# Patient Record
Sex: Female | Born: 1990 | Race: Asian | Hispanic: No | Smoking: Never smoker
Health system: Southern US, Community
[De-identification: ages and names within clinical notes are randomized; demographics above are authoritative.]

## PROBLEM LIST (undated history)

## (undated) DIAGNOSIS — O99810 Abnormal glucose complicating pregnancy: Secondary | ICD-10-CM

## (undated) DIAGNOSIS — O4590 Premature separation of placenta, unspecified, unspecified trimester: Secondary | ICD-10-CM

## (undated) HISTORY — DX: Abnormal glucose complicating pregnancy: O99.810

## (undated) HISTORY — DX: Premature separation of placenta, unspecified, unspecified trimester: O45.90

---
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
## 2008-01-07 ENCOUNTER — Ambulatory Visit: Payer: Self-pay | Admitting: *Deleted

## 2008-01-10 ENCOUNTER — Ambulatory Visit (HOSPITAL_COMMUNITY): Admission: RE | Admit: 2008-01-10 | Discharge: 2008-01-10 | Payer: Self-pay | Admitting: Obstetrics & Gynecology

## 2008-01-10 ENCOUNTER — Ambulatory Visit: Payer: Self-pay | Admitting: Obstetrics & Gynecology

## 2008-01-21 ENCOUNTER — Ambulatory Visit: Payer: Self-pay | Admitting: Obstetrics & Gynecology

## 2008-01-23 ENCOUNTER — Encounter: Payer: Self-pay | Admitting: Obstetrics & Gynecology

## 2008-01-23 ENCOUNTER — Inpatient Hospital Stay (HOSPITAL_COMMUNITY): Admission: AD | Admit: 2008-01-23 | Discharge: 2008-01-26 | Payer: Self-pay | Admitting: Obstetrics & Gynecology

## 2008-01-23 ENCOUNTER — Ambulatory Visit: Payer: Self-pay | Admitting: Obstetrics & Gynecology

## 2008-01-28 ENCOUNTER — Encounter (INDEPENDENT_AMBULATORY_CARE_PROVIDER_SITE_OTHER): Payer: Self-pay | Admitting: Family Medicine

## 2008-03-13 ENCOUNTER — Telehealth: Payer: Self-pay | Admitting: *Deleted

## 2008-03-27 ENCOUNTER — Ambulatory Visit: Payer: Self-pay | Admitting: Family Medicine

## 2008-07-03 ENCOUNTER — Encounter: Payer: Self-pay | Admitting: Family Medicine

## 2008-07-03 ENCOUNTER — Ambulatory Visit: Payer: Self-pay | Admitting: Family Medicine

## 2008-07-03 LAB — CONVERTED CEMR LAB
Beta hcg, urine, semiquantitative: NEGATIVE
HCT: 37 % (ref 36.0–46.0)
Hemoglobin: 12 g/dL (ref 12.0–15.0)
MCHC: 32.4 g/dL (ref 30.0–36.0)
RDW: 13.6 % (ref 11.5–15.5)

## 2008-07-04 ENCOUNTER — Encounter: Payer: Self-pay | Admitting: Family Medicine

## 2008-08-27 ENCOUNTER — Emergency Department (HOSPITAL_COMMUNITY): Admission: EM | Admit: 2008-08-27 | Discharge: 2008-08-27 | Payer: Self-pay | Admitting: *Deleted

## 2010-04-14 ENCOUNTER — Emergency Department (HOSPITAL_COMMUNITY): Admission: EM | Admit: 2010-04-14 | Discharge: 2010-04-14 | Payer: Self-pay | Admitting: Family Medicine

## 2010-05-14 ENCOUNTER — Ambulatory Visit: Payer: Self-pay | Admitting: Family Medicine

## 2010-06-08 ENCOUNTER — Ambulatory Visit: Payer: Self-pay | Admitting: Family Medicine

## 2010-06-08 ENCOUNTER — Encounter: Payer: Self-pay | Admitting: Family Medicine

## 2010-06-08 LAB — CONVERTED CEMR LAB
Antibody Screen: NEGATIVE
Eosinophils Absolute: 0.1 10*3/uL (ref 0.0–0.7)
Eosinophils Relative: 1 % (ref 0–5)
HCT: 32.7 % — ABNORMAL LOW (ref 36.0–46.0)
Hepatitis B Surface Ag: NEGATIVE
Lymphs Abs: 1.7 10*3/uL (ref 0.7–4.0)
MCV: 73.5 fL — ABNORMAL LOW (ref 78.0–100.0)
Monocytes Absolute: 0.4 10*3/uL (ref 0.1–1.0)
Monocytes Relative: 5 % (ref 3–12)
Platelets: 187 10*3/uL (ref 150–400)
Rh Type: POSITIVE
Sickle Cell Screen: NEGATIVE
WBC: 7.4 10*3/uL (ref 4.0–10.5)

## 2010-06-11 ENCOUNTER — Ambulatory Visit: Payer: Self-pay | Admitting: Family Medicine

## 2010-06-25 DIAGNOSIS — O4590 Premature separation of placenta, unspecified, unspecified trimester: Secondary | ICD-10-CM

## 2010-06-25 HISTORY — DX: Premature separation of placenta, unspecified, unspecified trimester: O45.90

## 2010-07-10 ENCOUNTER — Inpatient Hospital Stay (HOSPITAL_COMMUNITY)
Admission: AD | Admit: 2010-07-10 | Discharge: 2010-07-10 | Payer: Self-pay | Source: Home / Self Care | Admitting: Family Medicine

## 2010-07-12 ENCOUNTER — Encounter: Payer: Self-pay | Admitting: *Deleted

## 2010-07-29 ENCOUNTER — Encounter: Payer: Self-pay | Admitting: Obstetrics & Gynecology

## 2010-07-29 ENCOUNTER — Ambulatory Visit: Payer: Self-pay | Admitting: Obstetrics & Gynecology

## 2010-08-09 ENCOUNTER — Ambulatory Visit (HOSPITAL_COMMUNITY)
Admission: RE | Admit: 2010-08-09 | Discharge: 2010-08-09 | Payer: Self-pay | Source: Home / Self Care | Attending: Family Medicine | Admitting: Family Medicine

## 2010-08-26 ENCOUNTER — Ambulatory Visit: Admit: 2010-08-26 | Payer: Self-pay | Admitting: Obstetrics and Gynecology

## 2010-09-12 ENCOUNTER — Encounter: Payer: Self-pay | Admitting: Family Medicine

## 2010-09-12 ENCOUNTER — Encounter: Payer: Self-pay | Admitting: *Deleted

## 2010-09-23 NOTE — Assessment & Plan Note (Signed)
Summary: Initial OB visit   Vital Signs:  Patient profile:   20 year old female LMP:     03/30/2010 Height:      61.75 inches Weight:      118 pounds BMI:     21.84 Temp:     97.5 degrees F oral Pulse rate:   83 / minute BP sitting:   105 / 61  (left arm) Cuff size:   regular  Vitals Entered ByBethena Midget  (June 11, 2010 3:34 PM) CC: OB  10    1/7 LMP (date): 03/30/2010 EDC by LMP==> 01/04/2011 EDC 01/04/2011 LMP - Character: normal LMP - Reliable? YES Menarche (age onset years): 12   Menses interval (days): 28 Menstrual flow (days): 6 On BCP's at conception: no Date of + home preg. test: 06/23/2007 Enter LMP: 03/30/2010 Last PAP Result Normal, Satisfactory   Visit Type:  New OB Primary Provider:  Priscella Mann MD  CC:  OB  10    1/7.  History of Present Illness: Pt feels safe @ home; financially stable environment. Pregnancy was unexpected but is now anticipated.  Fairly certain of LMP; had been having regular monthly periods up until then.   Current Medications (verified): 1)  Th Prenatal Vitamins 28-0.8 Mg Tabs (Prenatal Vit-Fe Fumarate-Fa) .Marland Kitchen.. 1 By Mouth Daily 2)  Promethazine Hcl 25 Mg Tabs (Promethazine Hcl) .Marland Kitchen.. 1 By Mouth Q 4-6 Hrs As Needed Nausea  Allergies (verified): No Known Drug Allergies  Past History:  Past Medical History: h/o childhood asthma h/o amenorrhea/dysfunctional uterine bleeding  GERD alleviated by occasional OTC Prilosec  Past Surgical History: C-section for previous pregnancy in 2009 due to placental abruption @  ~32 weeks.   Family History: Father: severe gastritis  Social History: Lives with husband and baby girl (now 2 1/2 years). Occupation: Attends [employer]; good grades.   No tobacco, EtOH, or recreational drugs.    Physical Exam  General:  NAD; appears happy.  Neck:  no masses.   Lungs:  CTAB Heart:  RRR, no m/r/g Abdomen:  NABS, soft, not yet gravid, fundus firm to deep palpation, NT Pulses:  2+  radial/DP pulses B/L Extremities:  no pedal edema B/L Psych:  normally interactive and good eye contact.     Impression & Recommendations:  Problem # 1:  PREGNANCY, NORMAL, MULTIGRAVIDA (ICD-V22.1) Assessment Deteriorated Patient looking forward to new baby and is in good social/financial situation.  Reassured pt of normal labs.  Discussed Integrative/Quad screen c pt who denied b/c would not change management of pregnancy.  Pt will f/u in 4-6 wks. Due to h/o GDM in previous pregnancy, asked pt make lab appt for 1-hr Glucola as soon as conveniently able.  Pt taking PNV & has promethazine for nausea. Reassured pt OK to take anti-emetic as needed (no harm to fetus).   Orders: Other OB visit- [facility] ([facility])  Complete Medication List: 1)  Th Prenatal Vitamins 28-0.8 Mg Tabs (Prenatal vit-fe fumarate-fa) .Marland Kitchen.. 1 by mouth daily 2)  Promethazine Hcl 25 Mg Tabs (Promethazine hcl) .Marland Kitchen.. 1 by mouth q 4-6 hrs as needed nausea  Patient Instructions: 1)  Please schedule 1 hour glucola before you go. 2)  Please schedule your next OB follow-up appointment in 4-6 weeks.  3)  It was nice to meet you.    Orders Added: 1)  Other OB visit- [facility] [[facility]]     Flowsheet View for Follow-up Visit    Estimated weeks of       gestation:  10 3/7    Weight:     118    Blood pressure:   105 / 61    Hx headache?     No    Nausea/vomiting?   No    Edema?     0    Bleeding?     no    Leakage/discharge?   no   OB Initial Intake Information    Positive HCG by: self    Race: Oriental/Asian    Marital status: Single    Occupation: student    Type of work: [employer] [place]    Number of children at home: Intel of delivery: [facility] [place]    Newborn's physician: Ancil Boozer MD  FOB Information    Husband/Father of baby: Delice Lesch    FOB occupation Holiday representative    Phone: [phone]    FOB Comments: Involved.  Currently unemployed.    Menstrual History    LMP (date):  03/30/2010    EDC by LMP: 01/04/2011    Best Working EDC: 01/04/2011    LMP - Character: normal    LMP - Reliable? : YES    Menarche: 12 years    Menses interval: 28 days    Menstrual flow 6 days    On BCP's at conception: no    Date of positive (+) home preg. test: 06/23/2007    Pre Pregnancy Weight: 115 lbs.  Prenatal Visit EDC Confirmation:    New working [facility]: 01/04/2011    LMP reliable? YES    Last menses onset (LMP) date: 03/30/2010    EDC by LMP: 01/04/2011   Past Pregnancy History    Gravida:     2    Premature Births:   1    Living Children:   1    Para:       1    Prev C-Section:   1  Pregnancy # 1    Delivery type:     C-section    Delivery location:     [facility]    Infant Sex:     Female   Genetic History    Father of baby:   Delice Lesch   Flowsheet View for Follow-up Visit    Estimated weeks of       gestation:     10 3/7    Weight:     118    Blood pressure:   105 / 61    Headache:     No    Nausea/vomiting:   No    Edema:     0    Vaginal bleeding:   no    Vaginal discharge:   no  Appended Document: Initial OB visit Chart reviewed.  Would add prior 32-week delivery 2o to abruption to OB Hx.  For referral to [facility] consult in light of 32-week preterm delivery.  Needs cervical cultures (GC/Chlamydia).  Appended Document: Referral        Past Pregnancy History  Pregnancy #1  Gestational age at delivery: 39 weeks  Delivery type: C-section  [facility]  Comments: C/S due to abruption     Today's Evaluation EGA: [redacted]W[redacted]D    New Problems PLACENTA ABRUPTIO (ICD-641.20)   Allergies NKA              Initial Obstetrical Visit HPI Birttany is a 20 Years Old G: 2 P: 0 1 0 1  who presents for initial visit. She has had no spontaneous abortions.  She has not had any elective abortions. Patient has no history of ectopic pregnancies.       Prior Pregnancies         Prior C-sect: 1.          Prior multiple gestations:  0.      OB Initial Intake Information    Positive HCG by: self    Race: Oriental/Asian    Marital status: Single    Occupation: student    Type of work: [employer] [place]    Number of children at home: [employer] of delivery: [facility]    Newborn's physician: Ancil Boozer MD  FOB Information    Husband/Father of baby: Delice Lesch    FOB occupation Holiday representative    Phone: [phone]    FOB Comments: Involved.  Currently unemployed.    Menstrual History    LMP (date): 03/30/2010    LMP - Character: normal    Menarche: 12 years    Menses interval: 28 days    Menstrual flow 6 days    On BCP's at conception: no    Date of positive (+) home preg. test: 06/23/2007    Past Pregnancy History  Pregnancy # [redacted]    Weeks Gestation:   32    Comments:     C/S due to abruption

## 2010-09-23 NOTE — Assessment & Plan Note (Signed)
Summary: preg test,df   Vital Signs:  Patient profile:   20 year old female Height:      61.75 inches Weight:      112 pounds BMI:     20.73 BSA:     1.49 Temp:     98.3 degrees F Pulse rate:   80 / minute BP sitting:   90 / 50  Vitals Entered By: Jone Baseman CMA (May 14, 2010 9:47 AM) CC: uPREG Is Patient Diabetic? No Pain Assessment Patient in pain? no        Primary Care Provider:  Ancil Boozer  MD  CC:  uPREG.  History of Present Illness: Here for preg. verification.  Last menses was normal and on 03/30/2010. 2 pos. UPT's at home.  Experiencing reflux, morning sickness and fatigue.  No abd. pain or bleeding. H/o C-S with last delivery secondary to placental abruption related to fall.  Habits & Providers  Alcohol-Tobacco-Diet     Tobacco Status: never     Cigarette Packs/Day: n/a  Current Problems (verified): 1)  Amenorrhea  (ICD-626.0) 2)  Dysfunctional Uterine Bleeding  (ICD-626.8) 3)  Fatigue  (ICD-780.79) 4)  Excessive or Frequent Menstruation  (ICD-626.2) 5)  Routine Postpartum Follow-up  (ICD-V24.2) 6)  Hx of Diabetes Mellitus, Gestational, Borderline  (ICD-648.80) 7)  Screening For Malignant Neoplasm of The Cervix  (ICD-V76.2)  Current Medications (verified): 1)  Reglan 10 Mg  Tabs (Metoclopramide Hcl) .Marland Kitchen.. 1 By Mouth Three Times A Day For Breast Milk Production.  Be Sure To Drink [employer].  Allergies (verified): No Known Drug Allergies  Past History:  Past Medical History: Last updated: 08/31/2007 h/o childhood asthma, monthly periods, ave flow, 4d, referred for corrective lenses 7/05, S/P HBV vaccine x3  Family History: Last updated: 10/19/2006 father has severe gastritis  Social History: Last updated: 07/03/2008 Lives with husband and baby.  Attends [employer]  Good grades.  No tobacco, EtOH, or recreational drugs.    Risk Factors: Smoking Status: never (05/14/2010) Packs/Day: n/a (05/14/2010)  Past Surgical History: none  - 03/17/2004 Caesarean section  Review of Systems  The patient denies fever, weight loss, chest pain, syncope, prolonged cough, headaches, abdominal pain, and hematochezia.    Physical Exam  General:  alert, well-developed, and well-nourished.   Neck:  supple.   Lungs:  normal respiratory effort.   Heart:  normal rate.   Abdomen:  soft and non-tender.     Impression & Recommendations:  Problem # 1:  PREGNANCY, NORMAL, MULTIGRAVIDA (ICD-V22.1)  Orders: [facility]- Est Level  3 ([address])  Complete Medication List: 1)  Th Prenatal Vitamins 28-0.8 Mg Tabs (Prenatal vit-fe fumarate-fa) .Marland Kitchen.. 1 by mouth daily 2)  Promethazine Hcl 25 Mg Tabs (Promethazine hcl) .Marland Kitchen.. 1 by mouth q 4-6 hrs as needed nausea  Other Orders: U Preg-[facility] ([address])  Patient Instructions: 1)  We will call you with your first prenatal appointment. Prescriptions: PROMETHAZINE HCL 25 MG TABS (PROMETHAZINE HCL) 1 by mouth q 4-6 hrs as needed nausea  #42 x 2   Entered and Authorized by:   Tinnie Gens MD   Signed by:   Tinnie Gens MD on 05/14/2010   Method used:   Electronically to        [facility]  [address] [phone]* (retail)       [address]       [place], [place]  [address]       Ph: [phone] or [phone]  Fax: [phone]   RxID:   [id number] TH PRENATAL VITAMINS 28-0.8 MG TABS (PRENATAL VIT-FE FUMARATE-FA) 1 by mouth daily  #180 x 3   Entered and Authorized by:   Tinnie Gens MD   Signed by:   Tinnie Gens MD on 05/14/2010   Method used:   [facility]        [facility] [phone]* (retail)       [address]       [place], [place]  [address]       Ph: [phone] or [phone]       Fax: [phone]   RxID:   [id number]   Laboratory Results   Urine Tests  Date/Time Received: May 14, 2010 9:46 AM  Date/Time Reported: May 14, 2010 10:05 AM     Urine HCG: positive Comments: ...............test performed  by......Marland KitchenBonnie A. Sw[place], MLS (ASCP)cm

## 2010-09-23 NOTE — Letter (Signed)
Summary: Handout Printed  Printed Handout:  - Prenatal-Flowsheet-[facility] 

## 2010-09-23 NOTE — Letter (Signed)
Summary: Generic Letter  All     ,     Phone:   Fax:     05/14/2010  [name]  Date of Birth  [birth year]   To Whom it May Concern:  This letter is written to confirm pregnancy.  She has a positive pregnancy test.  Her working [facility] is 01/06/2011.  Sincerely,     Tinnie Gens MD

## 2010-10-05 ENCOUNTER — Encounter: Payer: Self-pay | Admitting: *Deleted

## 2010-11-02 LAB — CBC
HCT: 31.4 % — ABNORMAL LOW (ref 36.0–46.0)
MCH: 26 pg (ref 26.0–34.0)
MCHC: 33.5 g/dL (ref 30.0–36.0)
RDW: 14.5 % (ref 11.5–15.5)

## 2010-11-02 LAB — WET PREP, GENITAL: Yeast Wet Prep HPF POC: NONE SEEN

## 2010-11-02 LAB — URINALYSIS, ROUTINE W REFLEX MICROSCOPIC
Bilirubin Urine: NEGATIVE
Hgb urine dipstick: NEGATIVE
Specific Gravity, Urine: 1.025 (ref 1.005–1.030)
pH: 6 (ref 5.0–8.0)

## 2010-11-02 LAB — POCT URINALYSIS DIPSTICK
Bilirubin Urine: NEGATIVE
Hgb urine dipstick: NEGATIVE
Ketones, ur: NEGATIVE mg/dL
Specific Gravity, Urine: 1.025 (ref 1.005–1.030)
pH: 7 (ref 5.0–8.0)

## 2010-11-02 LAB — GC/CHLAMYDIA PROBE AMP, GENITAL
Chlamydia, DNA Probe: NEGATIVE
GC Probe Amp, Genital: NEGATIVE

## 2010-11-05 LAB — POCT H PYLORI SCREEN: H. PYLORI SCREEN, POC: NEGATIVE

## 2010-11-05 LAB — POCT URINALYSIS DIPSTICK
Glucose, UA: NEGATIVE mg/dL
Hgb urine dipstick: NEGATIVE
Nitrite: NEGATIVE
Urobilinogen, UA: 1 mg/dL (ref 0.0–1.0)

## 2010-11-05 LAB — POCT PREGNANCY, URINE: Preg Test, Ur: NEGATIVE

## 2010-11-22 ENCOUNTER — Other Ambulatory Visit: Payer: Self-pay | Admitting: Obstetrics & Gynecology

## 2010-11-22 DIAGNOSIS — O34219 Maternal care for unspecified type scar from previous cesarean delivery: Secondary | ICD-10-CM

## 2010-11-22 DIAGNOSIS — Z331 Pregnant state, incidental: Secondary | ICD-10-CM

## 2010-11-22 LAB — POCT URINALYSIS DIP (DEVICE)
Glucose, UA: NEGATIVE mg/dL
Nitrite: NEGATIVE
Protein, ur: NEGATIVE mg/dL
Specific Gravity, Urine: 1.025 (ref 1.005–1.030)
Urobilinogen, UA: 0.2 mg/dL (ref 0.0–1.0)

## 2010-11-29 ENCOUNTER — Other Ambulatory Visit: Payer: Self-pay | Admitting: Obstetrics & Gynecology

## 2010-11-29 ENCOUNTER — Other Ambulatory Visit: Payer: Self-pay | Admitting: Obstetrics and Gynecology

## 2010-11-29 DIAGNOSIS — O34219 Maternal care for unspecified type scar from previous cesarean delivery: Secondary | ICD-10-CM

## 2010-11-29 DIAGNOSIS — Z331 Pregnant state, incidental: Secondary | ICD-10-CM

## 2010-11-29 LAB — POCT URINALYSIS DIP (DEVICE)
Bilirubin Urine: NEGATIVE
Glucose, UA: NEGATIVE mg/dL
Hgb urine dipstick: NEGATIVE
Ketones, ur: NEGATIVE mg/dL
Nitrite: NEGATIVE
Protein, ur: NEGATIVE mg/dL
Specific Gravity, Urine: 1.02 (ref 1.005–1.030)
Urobilinogen, UA: 0.2 mg/dL (ref 0.0–1.0)
pH: 8 (ref 5.0–8.0)

## 2010-12-02 ENCOUNTER — Ambulatory Visit (HOSPITAL_COMMUNITY)
Admission: RE | Admit: 2010-12-02 | Discharge: 2010-12-02 | Disposition: A | Discharge status: Home / Self Care | Payer: Self-pay | Source: Ambulatory Visit | Attending: Obstetrics & Gynecology | Admitting: Obstetrics & Gynecology

## 2010-12-02 ENCOUNTER — Other Ambulatory Visit: Payer: Self-pay | Admitting: Obstetrics & Gynecology

## 2010-12-02 DIAGNOSIS — O34219 Maternal care for unspecified type scar from previous cesarean delivery: Secondary | ICD-10-CM

## 2010-12-02 DIAGNOSIS — O09299 Supervision of pregnancy with other poor reproductive or obstetric history, unspecified trimester: Secondary | ICD-10-CM | POA: Insufficient documentation

## 2010-12-06 LAB — POCT I-STAT, CHEM 8
BUN: 15 mg/dL (ref 6–23)
Creatinine, Ser: 0.8 mg/dL (ref 0.4–1.2)
Hemoglobin: 13.9 g/dL (ref 12.0–15.0)
Potassium: 3.8 mEq/L (ref 3.5–5.1)
Sodium: 139 mEq/L (ref 135–145)
TCO2: 25 mmol/L (ref 0–100)

## 2010-12-06 LAB — DIFFERENTIAL
Basophils Relative: 1 % (ref 0–1)
Lymphocytes Relative: 27 % (ref 12–46)
Lymphs Abs: 1.8 10*3/uL (ref 0.7–4.0)
Monocytes Absolute: 0.3 10*3/uL (ref 0.1–1.0)
Monocytes Relative: 4 % (ref 3–12)
Neutro Abs: 4.7 10*3/uL (ref 1.7–7.7)
Neutrophils Relative %: 68 % (ref 43–77)

## 2010-12-06 LAB — URINALYSIS, ROUTINE W REFLEX MICROSCOPIC
Bilirubin Urine: NEGATIVE
Hgb urine dipstick: NEGATIVE
Nitrite: NEGATIVE
Specific Gravity, Urine: 1.036 — ABNORMAL HIGH (ref 1.005–1.030)
Urobilinogen, UA: 0.2 mg/dL (ref 0.0–1.0)
pH: 6 (ref 5.0–8.0)

## 2010-12-06 LAB — POCT PREGNANCY, URINE: Preg Test, Ur: NEGATIVE

## 2010-12-06 LAB — CBC
Hemoglobin: 12.7 g/dL (ref 12.0–15.0)
RBC: 5.18 MIL/uL — ABNORMAL HIGH (ref 3.87–5.11)
WBC: 6.9 10*3/uL (ref 4.0–10.5)

## 2010-12-09 DIAGNOSIS — Z0189 Encounter for other specified special examinations: Secondary | ICD-10-CM

## 2010-12-23 ENCOUNTER — Inpatient Hospital Stay (HOSPITAL_COMMUNITY)
Admission: AD | Admit: 2010-12-23 | Discharge: 2010-12-25 | DRG: 775 | Disposition: A | Discharge status: Home / Self Care | Payer: Medicaid Other | Source: Ambulatory Visit | Attending: Obstetrics & Gynecology | Admitting: Obstetrics & Gynecology

## 2010-12-23 ENCOUNTER — Other Ambulatory Visit: Payer: Self-pay | Admitting: Obstetrics and Gynecology

## 2010-12-23 DIAGNOSIS — O34219 Maternal care for unspecified type scar from previous cesarean delivery: Secondary | ICD-10-CM

## 2010-12-23 DIAGNOSIS — Z331 Pregnant state, incidental: Secondary | ICD-10-CM

## 2010-12-23 DIAGNOSIS — O09299 Supervision of pregnancy with other poor reproductive or obstetric history, unspecified trimester: Secondary | ICD-10-CM

## 2010-12-23 DIAGNOSIS — O0930 Supervision of pregnancy with insufficient antenatal care, unspecified trimester: Secondary | ICD-10-CM

## 2010-12-23 LAB — CBC
HCT: 34.8 % — ABNORMAL LOW (ref 36.0–46.0)
Hemoglobin: 11.8 g/dL — ABNORMAL LOW (ref 12.0–15.0)
RBC: 4.61 MIL/uL (ref 3.87–5.11)

## 2010-12-23 LAB — POCT URINALYSIS DIP (DEVICE)
Bilirubin Urine: NEGATIVE
Glucose, UA: NEGATIVE mg/dL
Ketones, ur: NEGATIVE mg/dL
Nitrite: NEGATIVE

## 2010-12-24 LAB — RPR: RPR Ser Ql: NONREACTIVE

## 2011-01-04 NOTE — Op Note (Signed)
NAMEJONICE, CERRA NO.:  [id number]   MEDICAL RECORD NO.:  [id number]          PATIENT TYPE:  INP   LOCATION:  9141                          FACILITY:  [facility]   PHYSICIAN:  Lazaro Arms, M.D.   DATE OF BIRTH:  11/20/1990   DATE OF PROCEDURE:  01/23/2008  DATE OF DISCHARGE:                               OPERATIVE REPORT   PREOPERATIVE DIAGNOSES:  1. Intrauterine pregnancy at 46 and 4/7th weeks' gestation.  2. Partial placental abruption.  3. Fetal tachycardia, persistent.   POSTOPERATIVE DIAGNOSES:  1. Intrauterine pregnancy at 80 and 4/7th weeks' gestation.  2. Partial placental abruption.  3. Fetal tachycardia, persistent.   PROCEDURES:  Primary low transverse cesarean section.   SURGEON:  Lazaro Arms, MD.   ANESTHESIA:  Spinal by Dr. Jean Rosenthal.   FINDINGS:  The patient came in early in the day after suffering a  relatively minor fall on her buttocks.  No significant trauma, no  bleeding, was having some cramping, so she came in, was evaluated with  ultrasound, and no evidence of placental abruption at that point.  She  was on a monitor and had been for several hours and everything looked  great; however, she developed fetal tachycardia around 1500.  We decided  to do abruption labs and repeat the ultrasound, we did not give a chance  to get the labs back, but the ultrasound now showed blood behind the  placenta and the fetal tachycardia was in the 190s and the maternal  tachycardia was in the 130s.  As a result, I decided to proceed with  cesarean section.  At the time of section, it was probably kind of  guessing about 250 to 300 mL of fresh blood in the uterus.  There was no  adherent clot at all.  It did appear to be fresh.  The cord pH was 7.36.  The Apgars were 8 and 9.   DESCRIPTION OF OPERATION:  The patient was taken to the operating room,  placed in a sitting position where she underwent a spinal anesthetic.  She was placed in supine  position with tilt to the left, prepped and  draped in usual fashion.  Foley catheter had been placed.  Pfannenstiel  skin incision was made, carried down sharply to the rectus fascia,  scored in midline, extended laterally.  Fascia was taken off the muscle  superiorly and inferiorly without difficulty.  The muscles were divided.  Peritoneal cavity was entered.  Bladder blade was placed.  Vesicouterine  serosal flap was created.  Low transverse hysterotomy incision was made.  Over this incision, she delivered a viable female infant at 10 with  Apgars of 8 and 9 with the baby at that time in the nursery.  There is  three-vessel cord.  Cord blood and cord gas was sent.  Neonatologist was  in attendance for routine neonatal resuscitation.  The cord pH was 7.36.  There was approximately 250 to 300 mL of blood present in the uterus at  the time of the uterine incision.  The placenta was  delivered  spontaneously and sent to pathology for evaluation.  The uterus was  closed in two layers, the first being a running interlocking layer, the  second being an imbricating layer.  Interrupted figure-of-eight sutures  were placed for additional hemostasis.  The uterus was replaced to the  peritoneal cavity.  The pelvis was irrigated and all pedicles were found  to be hemostatic.  The muscles and peritoneum reapproximated loosely.  The fascia closed using 0 Vicryl running.  The skin was closed using 4-0  Vicryl in a Keith  needle and Dermabond.  The patient tolerated the procedure well.  She  experienced 1000 mL of blood loss in toto including the blood that was  found at the uterus.  She received Ancef.  The cord was clamped.  She  was taken to recovery room in good stable condition.  All counts  correct.      Lazaro Arms, M.D.  Electronically Signed     LHE/MEDQ  D:  01/23/2008  T:  01/24/2008  Job:  [id number]

## 2011-01-04 NOTE — Discharge Summary (Signed)
[name], [name] NO.:  [id number]   MEDICAL RECORD NO.:  [id number]          PATIENT TYPE:  INP   LOCATION:                                [facility]   PHYSICIAN:  Tilda Burrow, M.D. DATE OF BIRTH:  [birth year]   DATE OF ADMISSION:  01/23/2008  DATE OF DISCHARGE:  01/26/2008                               DISCHARGE SUMMARY   ADMITTING DIAGNOSES:  1. Pregnancy, 35 weeks' gestation.  2. Status post fall, rule out abruption.   DISCHARGE DIAGNOSES:  1. Pregnancy, 35-1/2 weeks, delivered.  2. Placental abruption, status post fall.  3. Thrombocytopenia, status post abruption, resolved.   PROCEDURE:  1. Obstetric ultrasound.  2. Primary low transverse cervical cesarean section, by Turner Daniels, MD   DISCHARGE MEDICATIONS:  1. Prenatal vitamins daily.  2. Iron sulfate tablets twice daily x30 days.  3. Depo-Provera 150 mg IM q.3 months, given first dose before      discharge.  4. Vicodin 5/500 thirty tablets 1-2 q.6 h. p.r.n. pain.  5. Motrin 600 mg p.o. q.6 h. p.r.n. mild pain and cramps.   HOSPITAL SUMMARY:  This 20 year old gravida 1, para 0 was admitted on  early morning hours of January 23, 2008, after having a fall, where she fell  backwards, landed on her buttocks, with no direct abdominal trauma.  She  presented to [facility], where she was  evaluated.  Prenatal course was notable for gestational diabetes,  requiring glyburide 2.5 mg b.i.d., with an otherwise uneventful course  with normal antenatal labs.  Blood type was A positive.  Additional labs  include HIV nonreactive, fibrinogen 450 mg percent, INR 1.0, APTT 33,  RPR nonreactive.  She had admitting hemoglobin of 11.4, hematocrit 33.4,  and platelets were 152,000.  In the hospital, the patient was observed  through the day and monitored periodically.  On the afternoon of January 23, 2008, she was noted to have fetal tachycardia in the 180-190s and  maternal  tachycardia is well into the 120s.  Ultrasound revealed a small  placental abruption.  She was taken to the operating room for a primary  low transverse cervical cesarean section.  This revealed a 250-300 mL  area of fresh blood in the uterus with no adjacent adherent clot.  There  appeared to be fresh bleeding.  This occurred approximately at 3:00  p.m..  The infant was a healthy female infant.  Apgars 8 and 9.  Cord  blood pH 7.36.  Postoperative course is notable for hemoglobin dropped  to 9.0, postop 8.7, and then 9.4 on serial checks.  White count dropped  to as low of 4300.  Platelets went from 152,000 to 140,000 preop, and  then dropped to a nadir of 109,000 postop day #1 and then increased to  146,000 postop day #2.  The  patient had an uneventful course.  The baby did beautifully, was able to  go to the regular nursery, and will be discharged the same day with the  patient who will use Depo-Provera for contraception, breast-feed  with  bottle supplementation, and follow up in 4 weeks.  Routine postsurgical  instructions were reviewed.      Tilda Burrow, M.D.  Electronically Signed     JVF/MEDQ  D:  01/26/2008  T:  01/26/2008  Job:  [id number]

## 2011-02-04 ENCOUNTER — Ambulatory Visit: Payer: Self-pay | Admitting: Family Medicine

## 2011-04-15 IMAGING — US US OB COMP +14 WK
1 series · 12 of 28 positions shown · non-contrast
Comparison: none

[Series 1: us ob comp +14 wk · 0.27mm/px · 12 of 53 slices shown]
[im 2/53]
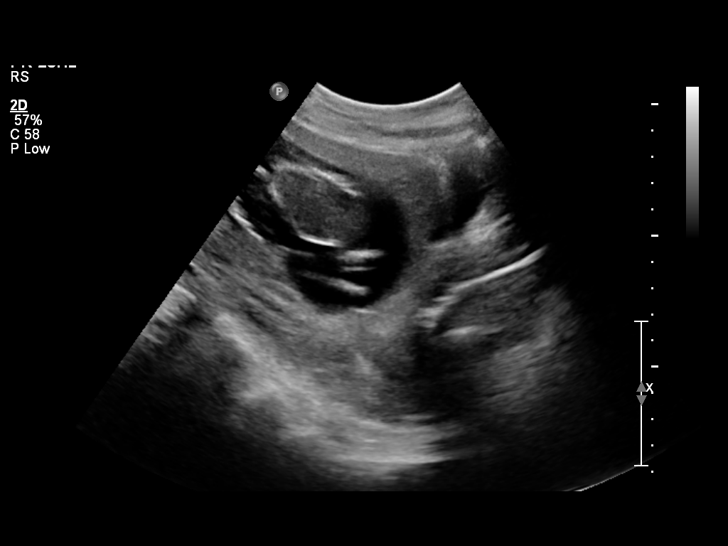
[im 6/53]
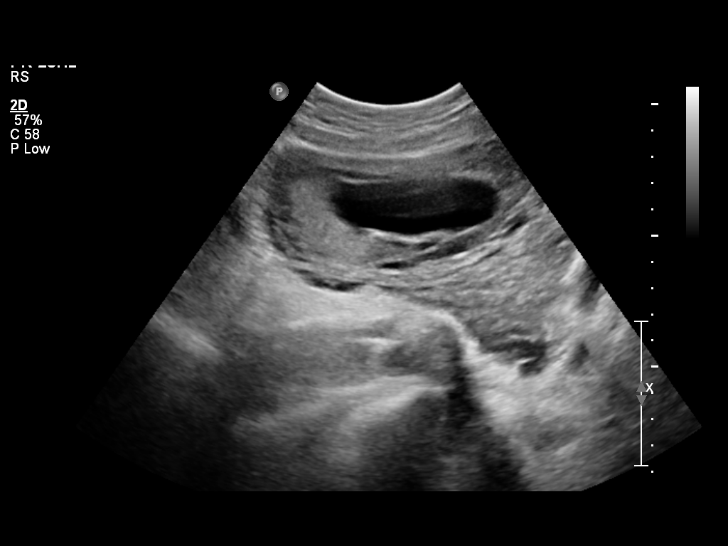
[im 10/53]
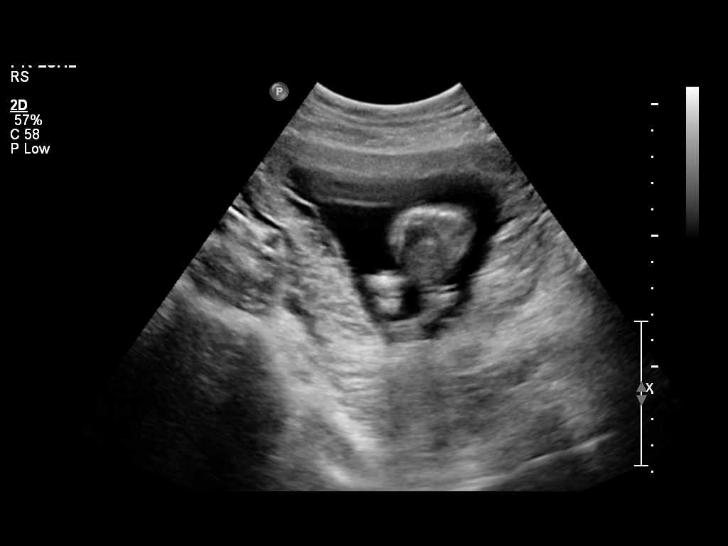
[im 16/53]
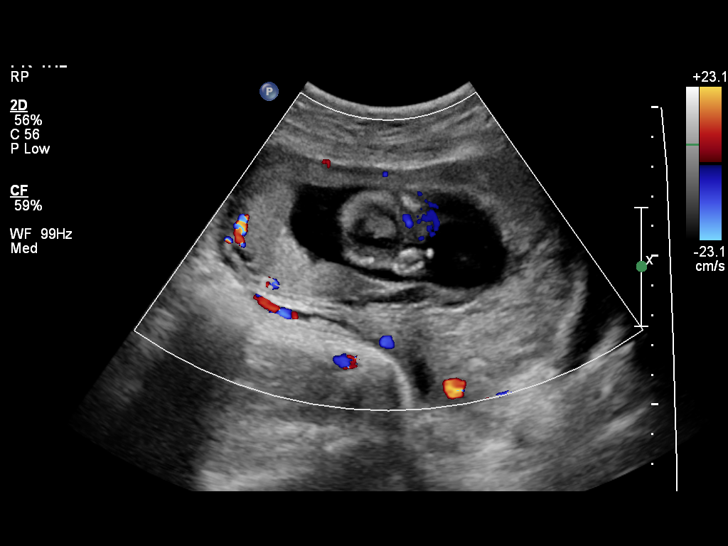
[im 20/53]
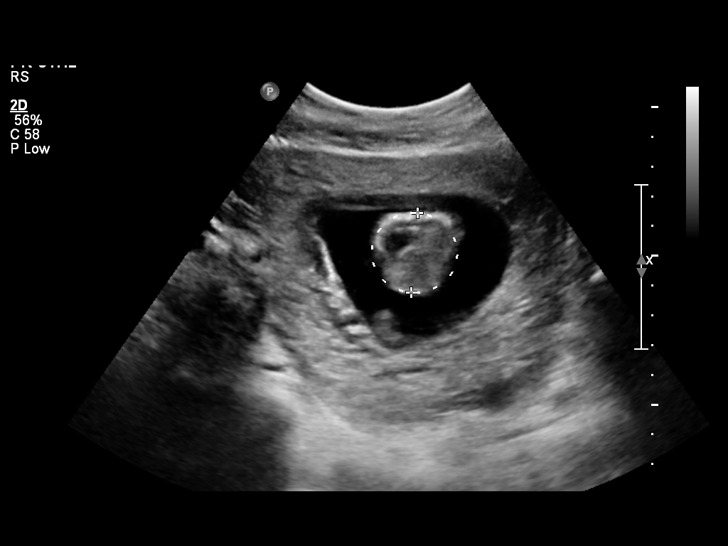
[im 24/53]
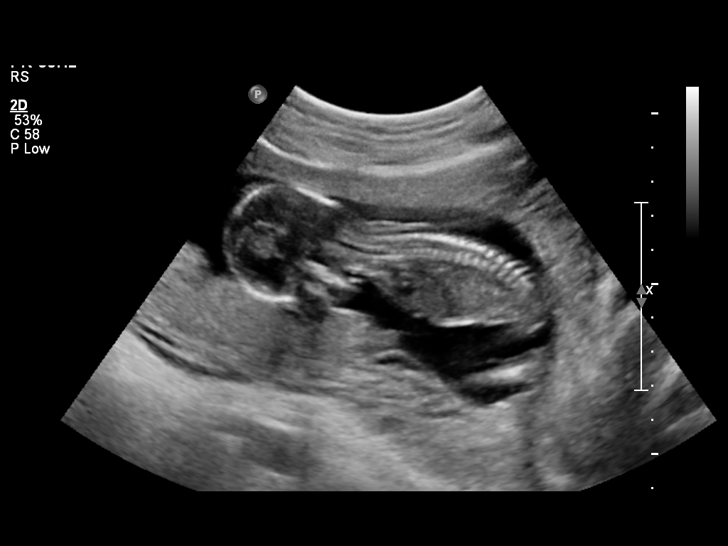
[im 29/53]
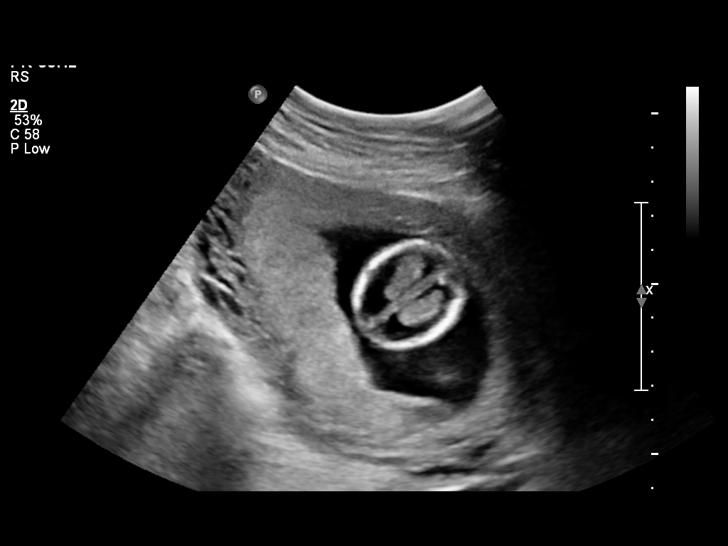
[im 33/53]
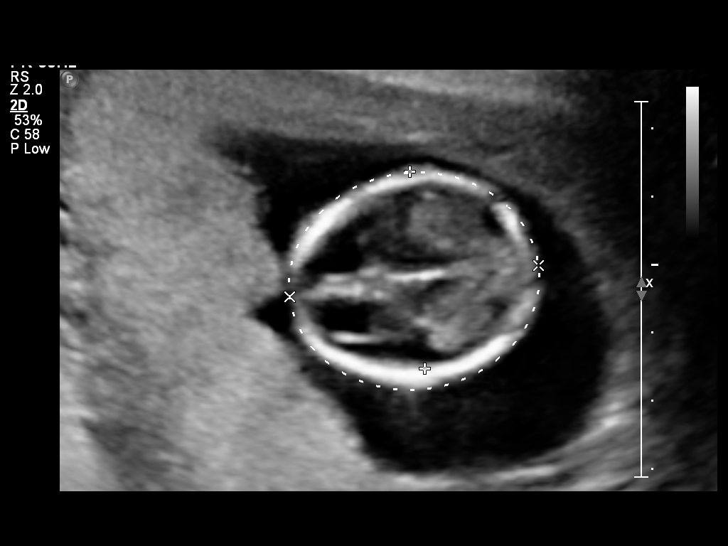
[im 37/53]
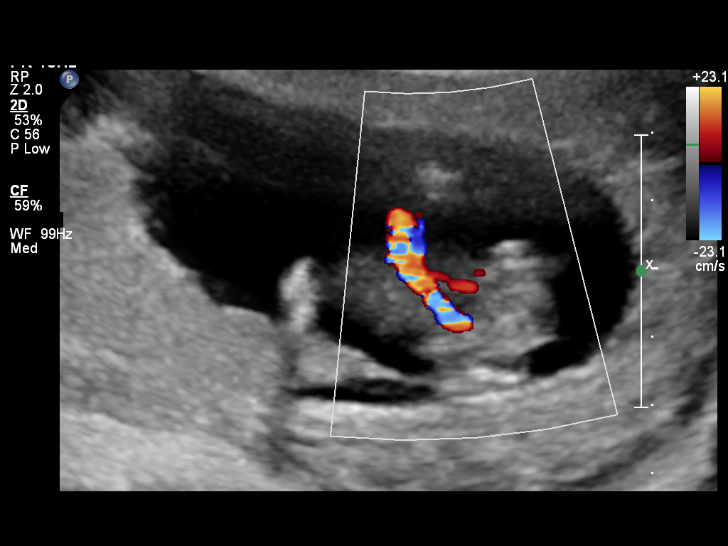
[im 43/53]
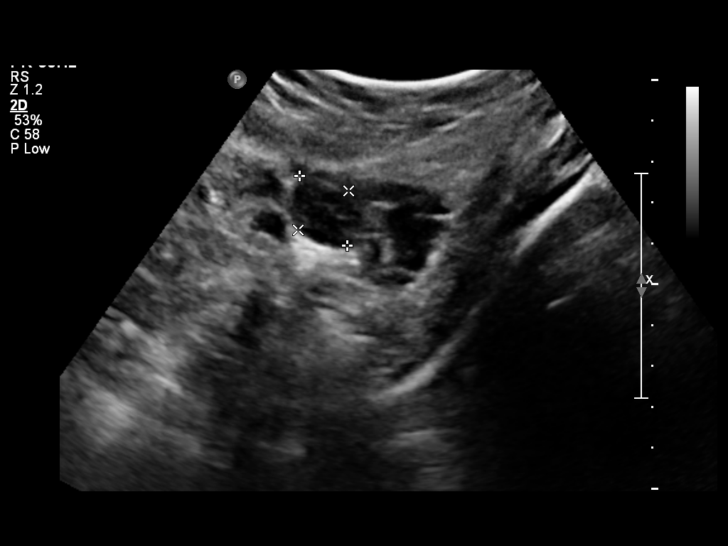
[im 47/53]
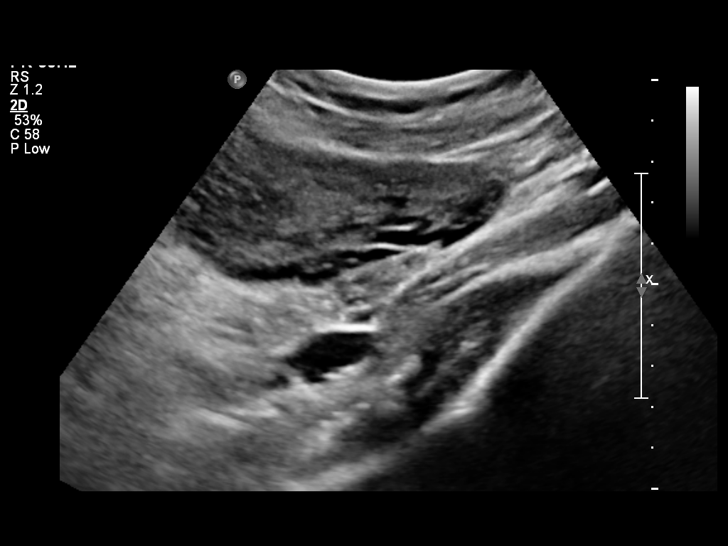
[im 51/53]
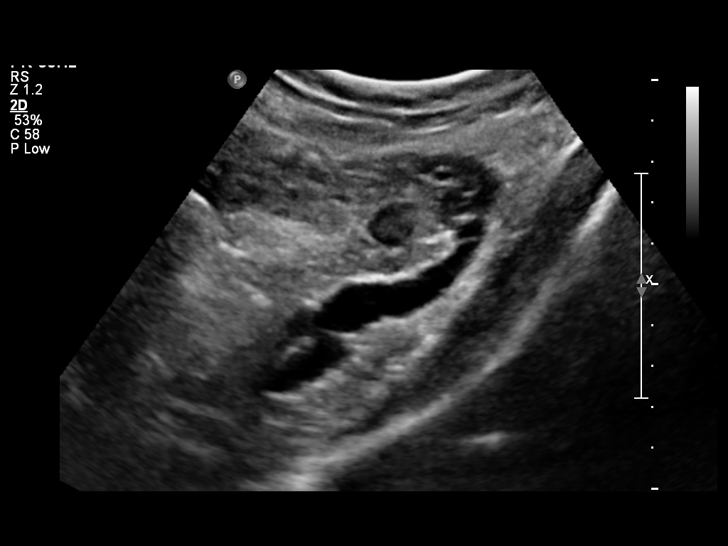

[12 of 28 positions shown; findings below may reference images not displayed]

OBSTETRICS REPORT
                      (Signed Final 07/12/2010 [DATE])

Procedures

 US OB COMP +14 WK                                     76805.1
Indications

 Pain - Abdominal/Pelvic ( LUQ)
Fetal Evaluation

 Fetal Heart Rate:  155                          bpm
 Cardiac Activity:  Observed
 Presentation:      Breech
 Placenta:          Posterior

 Comment:    Small subchorionic hemorrhage noted.

 Amniotic Fluid
 AFI FV:      Subjectively within normal limits
                                             Larg Pckt:     4.1  cm
Biometry

 CRL:       97  mm     G. Age:  N/A                    EDD:

 BPD:     29.1  mm     G. Age:  15w 2d                CI:         84.8   70 - 86
 OFD:     34.3  mm                                    FL/HC:      16.0   15.3 -

 HC:     107.8  mm     G. Age:  15w 1d                HC/AC:      1.24   1.05 -

 AC:      87.1  mm     G. Age:  15w 0d                FL/BPD:
 FL:      17.3  mm     G. Age:  15w 1d                FL/AC:      19.9   20 - 24

 Est. FW:     114  gm      0 lb 4 oz
Gestational Age

 LMP:           14w 4d        Date:  03/30/10                 EDD:   01/04/11
 U/S Today:     15w 1d                                        EDD:   12/31/10
 Best:          14w 4d     Det. By:  LMP  (03/30/10)          EDD:   01/04/11
Anatomy

 Cranium:           Appears normal      Aortic Arch:       Not well
                                                           visualized
 Fetal Cavum:       Not well            Ductal Arch:       Not well
                    visualized                             visualized
 Ventricles:        Not well            Diaphragm:         Not well
                    visualized                             visualized
 Choroid Plexus:    Appears normal      Stomach:           Appears
                                                           normal, left
                                                           sided
 Cerebellum:        Not well            Abdomen:           Appears normal
                    visualized
 Posterior Fossa:   Not well            Abdominal Wall:    Appears nml
                    visualized                             (cord insert,
                                                           abd wall)
 Nuchal Fold:       Not well            Cord Vessels:      Appears normal
                    visualized                             (3 vessel cord)
 Face:              Not well            Kidneys:           Not well
                    visualized                             visualized
 Heart:             Not well            Bladder:           Not well
                    visualized                             visualized
 RVOT:              Not well            Spine:             Not well
                    visualized                             visualized
 LVOT:              Not well            Limbs:             Four extremities
                    visualized                             seen

 Other:     Technically difficult due to early GA.
Cervix Uterus Adnexa

 Cervix:       Closed.

 Left Ovary:    Within normal limits.
 Right Ovary:   Within normal limits.
 Adnexa:     No abnormality visualized.
Impression

 Single live IUP, concordant measurements compared to
 assigned GA 13w3d.
 No early anatomic abnormality. Recommend routine
 anatomic survey in 3-4 weeks.
 No acute abnormality.

 questions or concerns.

## 2011-05-13 ENCOUNTER — Encounter: Payer: Medicaid Other | Admitting: Family Medicine

## 2011-05-13 ENCOUNTER — Encounter: Payer: Self-pay | Admitting: Family Medicine

## 2011-05-14 NOTE — Progress Notes (Signed)
This encounter was created in error - please disregard.

## 2011-05-15 IMAGING — US US OB DETAIL+14 WK
2 series · 12 of 28 positions shown · non-contrast
Comparison: none

[Series 1: us ob detail +14 wk · 2 of 8 slices shown (1 of 2)]
[im 3/8]
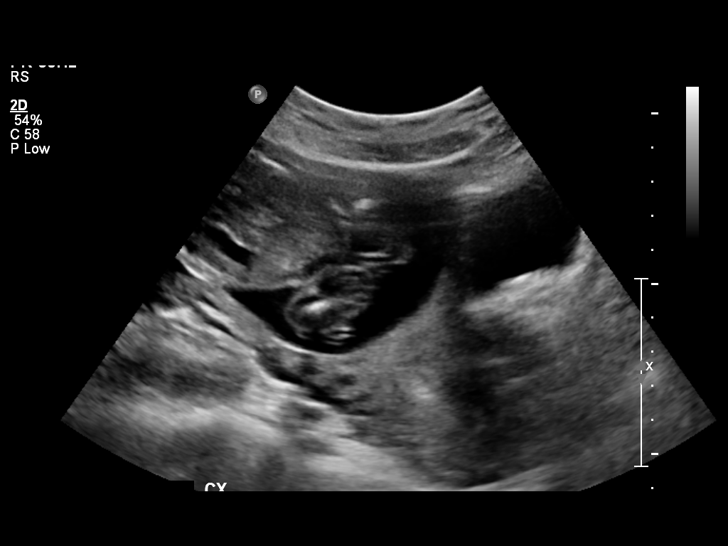
[im 8/8]
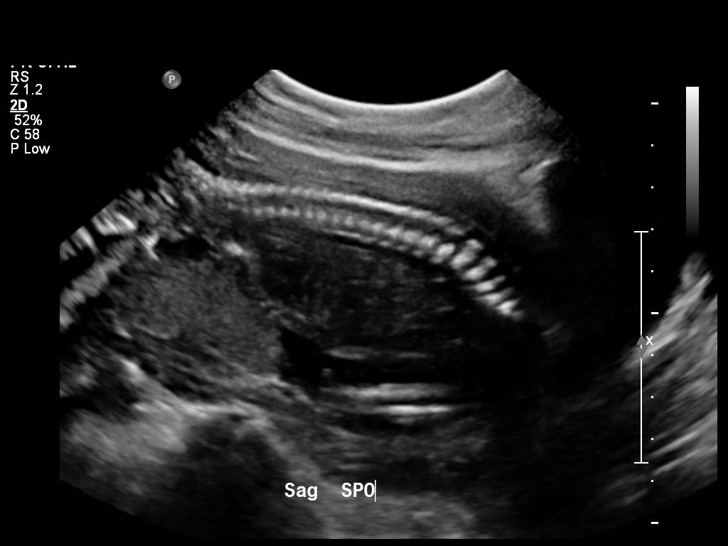

[Series 1: us ob detail +14 wk · 55 acquisitions, 10 frames shown (2 of 2)]
[im 3/55]
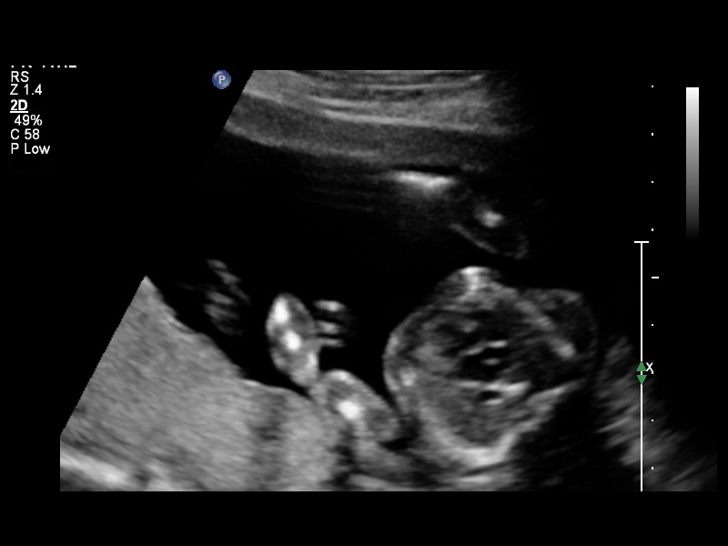
[im 10/55]
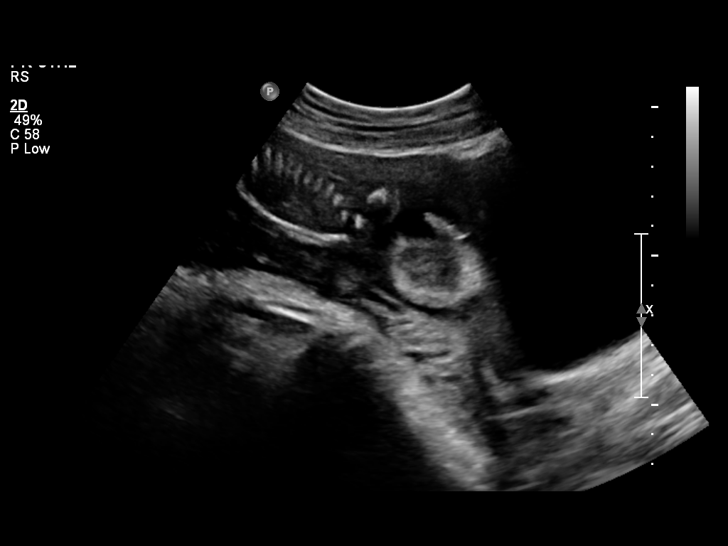
[im 15/55]
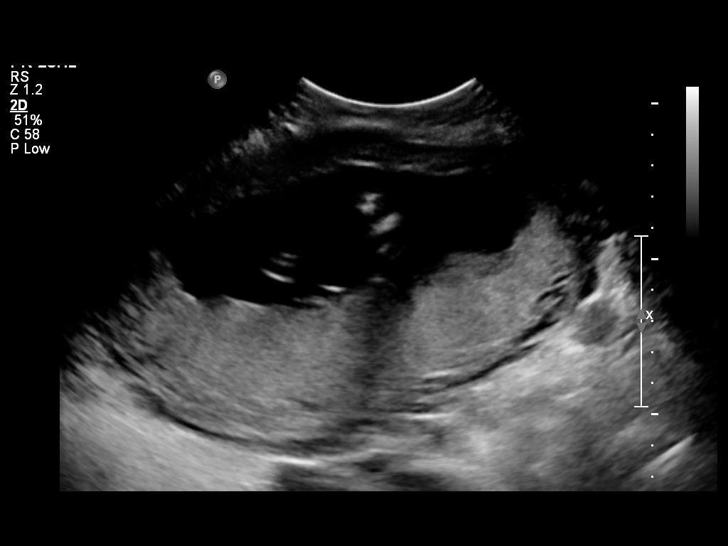
[im 19/55]
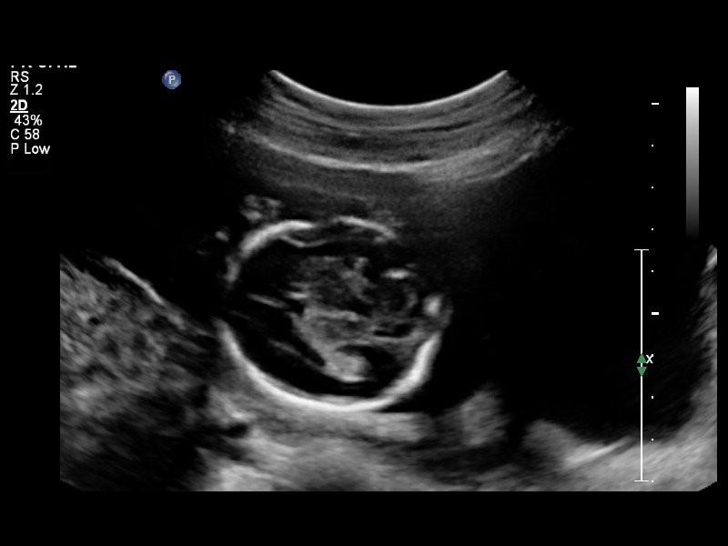
[im 26/55]
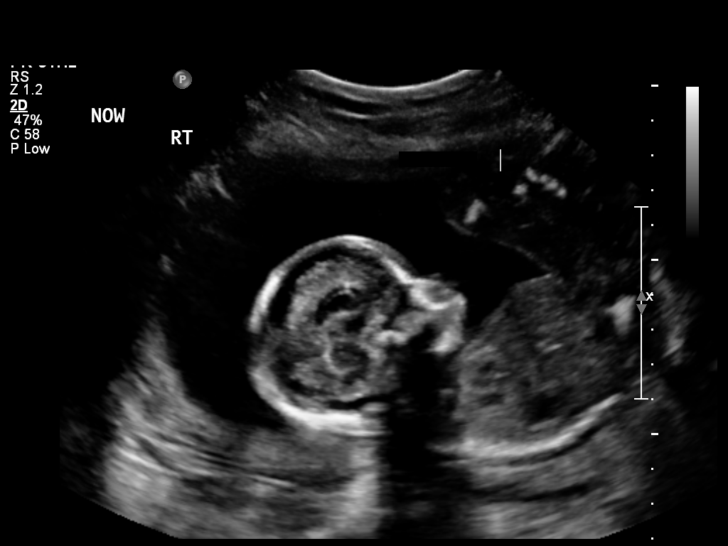
[im 31/55]
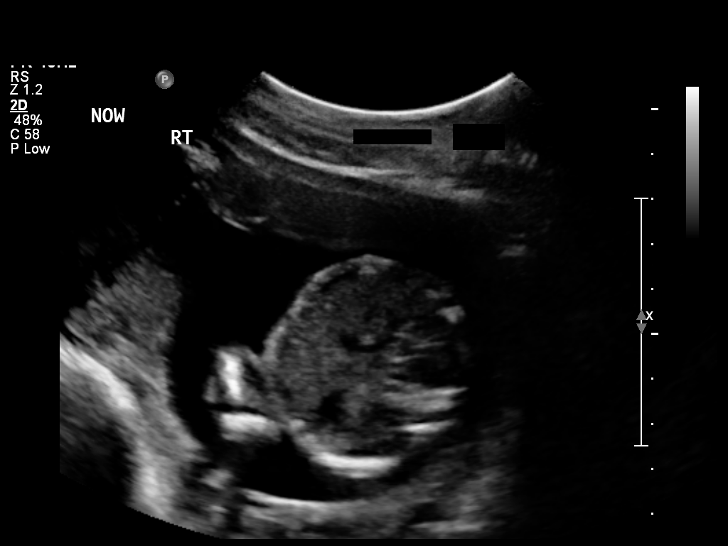
[im 36/55]
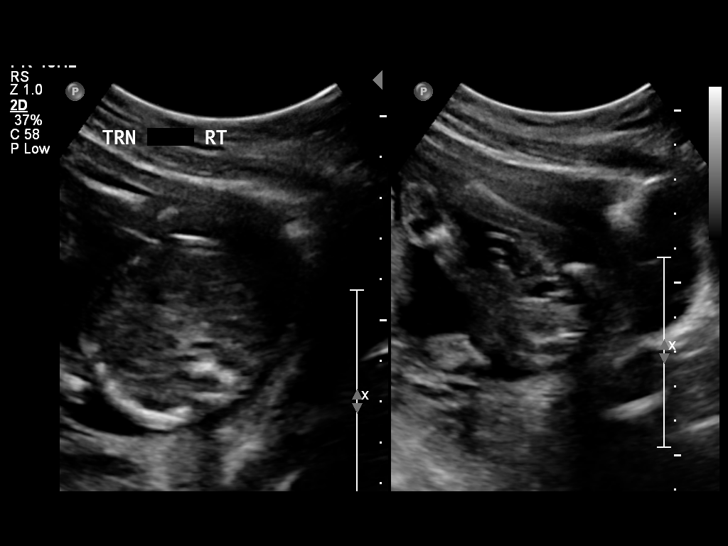
[im 43/55]
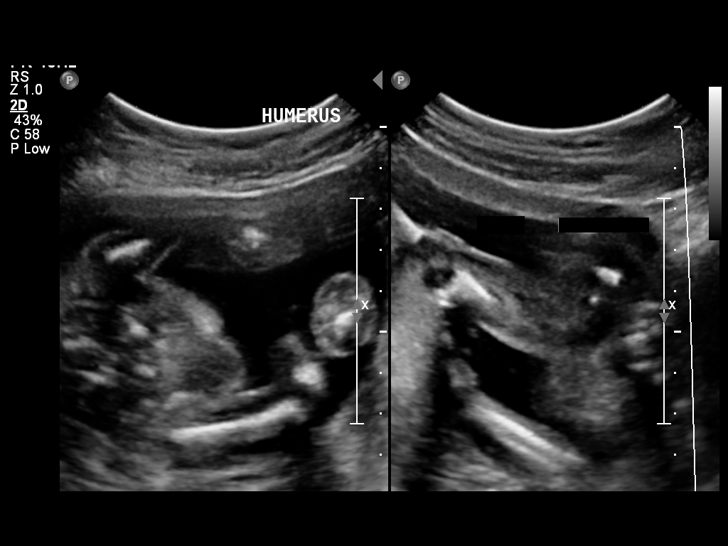
[im 47/55]
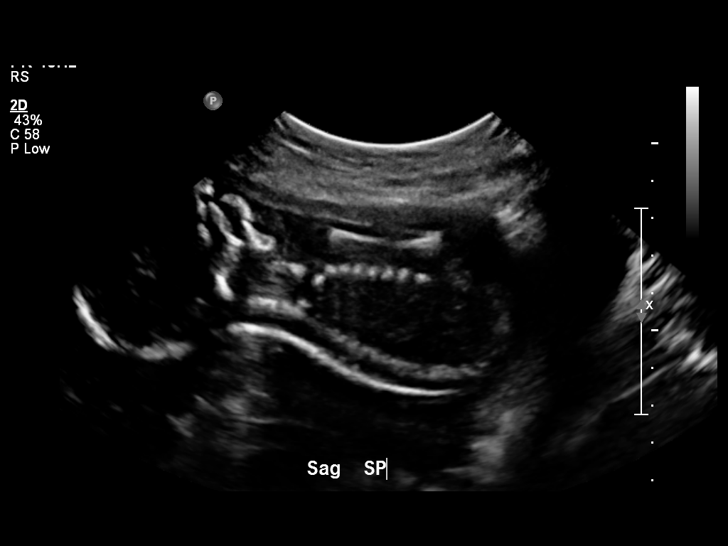
[im 52/55]
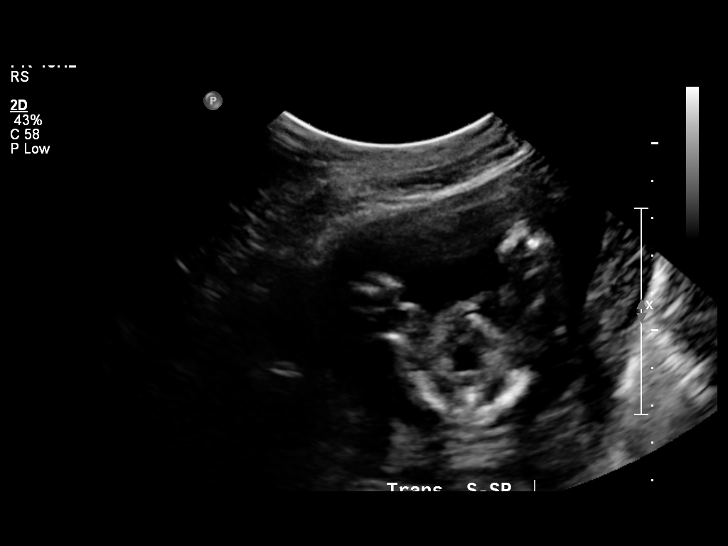

[12 of 28 positions shown; findings below may reference images not displayed]

OBSTETRICS REPORT
                      (Signed Final 08/09/2010 [DATE])

                 [facility]-
                 Faculty Physician
 Order#:         _O
Procedures

 US OB DETAIL +14 WK                                   76811.0
Indications

 Detailed fetal anatomic survey
Fetal Evaluation

 Fetal Heart Rate:  142                          bpm
 Cardiac Activity:  Observed
 Presentation:      Variable
 Placenta:          Posterior, above cervical
                    os
 P. Cord            Visualized
 Insertion:

 Amniotic Fluid
 AFI FV:      Subjectively within normal limits
                                             Larg Pckt:     4.6  cm
Biometry

 BPD:     43.2  mm     G. Age:  19w 0d                CI:        71.94   70 - 86
                                                      FL/HC:      18.9   16.1 -

 HC:     162.1  mm     G. Age:  19w 0d       49  %    HC/AC:      1.15   1.09 -

 AC:     140.9  mm     G. Age:  19w 3d       66  %    FL/BPD:
 FL:      30.7  mm     G. Age:  19w 4d       67  %    FL/AC:      21.8   20 - 24
 HUM:     29.1  mm     G. Age:  19w 3d       67  %
 NFT:     2.19  mm

 Est. FW:     292  gm    0 lb 10 oz      54  %
Gestational Age

 LMP:           18w 6d        Date:  03/30/10                 EDD:   01/04/11
 U/S Today:     19w 2d                                        EDD:   01/01/11
 Best:          18w 6d     Det. By:  LMP  (03/30/10)          EDD:   01/04/11
Genetic Sonogram - Trisomy 21 Screening

 Age:                                             19          Risk=1:   885
 Echogenic bowel:                                 No          LR :
 Hypoplastic/absent Nasal bone:                   No
 Choroid plexus cysts:                            No
 Structural anomalies (inc. cardiac):             No          LR :
 Hypoplastic / absent midphalanx 5th Digit:       No
 Short femur:                                     No          LR :
 Short humerus:                                   No          LR :
 2-vessel umbilical cord:                         No
 Pyelectasis:                                     No          LR :
 Echogenic cardiac foci:                          No          LR :
 Nuchal fold thickening >= 6 mm:                  No          LR :

 11 Of 11 Criteria Were Visualized and 0 Abnormal(s) Were Seen.
 Ultrasound Modified Risk for Fetal Down Syndrome = [DATE]
Anatomy

 Cranium:           Appears normal      Aortic Arch:       Appears normal
 Fetal Cavum:       Appears normal      Ductal Arch:       Appears normal
 Ventricles:        Appears normal      Diaphragm:         Appears normal
 Choroid Plexus:    Appears normal      Stomach:           Appears normal
 Cerebellum:        Appears normal      Abdomen:           Appears normal
 Posterior Fossa:   Appears normal      Abdominal Wall:    Appears nml
                                                           (cord insert,
                                                           abd wall)
 Nuchal Fold:       Appears normal      Cord Vessels:      Appears normal
                    (neck, nuchal                          (3 vessel cord)
                    fold)
 Face:              Appears normal      Kidneys:           Appear normal
                    (lips/profile/orbit
                    s)
 Heart:             Appears normal      Bladder:           Appears normal
                    (4 chamber &
                    axis)
 RVOT:              Appears normal      Spine:             Appears normal
 LVOT:              Appears normal      Limbs:             Appears normal
                                                           (hands, ankles,
                                                           feet)

 Other:     Female gender. Heels and 5th digit visualized. Nasal
            bone visualized.
Cervix Uterus Adnexa

 Cervical Length:    3.37     cm

 Cervix:       Closed.

 Adnexa:     No abnormality visualized.
Impression

 Single living IUP.  US EGA is concordant with LMP.
 No fetal anomalies seen involving visualized anatomy.
 No sonographic markers for aneuploidy visualized.
 questions or concerns.

## 2011-05-17 LAB — POCT URINALYSIS DIP (DEVICE)
Bilirubin Urine: NEGATIVE
Bilirubin Urine: NEGATIVE
Glucose, UA: NEGATIVE
Glucose, UA: NEGATIVE
Ketones, ur: NEGATIVE
Ketones, ur: NEGATIVE
Operator id: 194561
Operator id: 297281
Protein, ur: 30 — AB
Specific Gravity, Urine: 1.015

## 2011-05-18 LAB — POCT URINALYSIS DIP (DEVICE)
Bilirubin Urine: NEGATIVE
Glucose, UA: NEGATIVE
Ketones, ur: NEGATIVE
Operator id: 297281
Protein, ur: NEGATIVE

## 2011-05-19 ENCOUNTER — Encounter: Payer: Self-pay | Admitting: Family Medicine

## 2011-05-19 ENCOUNTER — Ambulatory Visit (INDEPENDENT_AMBULATORY_CARE_PROVIDER_SITE_OTHER): Payer: Medicaid Other | Admitting: Family Medicine

## 2011-05-19 VITALS — BP 97/63 | HR 74 | Temp 97.4°F | Ht 61.0 in | Wt 114.0 lb

## 2011-05-19 DIAGNOSIS — Z3046 Encounter for surveillance of implantable subdermal contraceptive: Secondary | ICD-10-CM

## 2011-05-19 DIAGNOSIS — Z309 Encounter for contraceptive management, unspecified: Secondary | ICD-10-CM

## 2011-05-19 DIAGNOSIS — Z30017 Encounter for initial prescription of implantable subdermal contraceptive: Secondary | ICD-10-CM

## 2011-05-19 LAB — CBC
HCT: 25.9 — ABNORMAL LOW
HCT: 27.3 — ABNORMAL LOW
HCT: 33.4 — ABNORMAL LOW
HCT: 34.6 — ABNORMAL LOW
Hemoglobin: 11.4 — ABNORMAL LOW
Hemoglobin: 11.7 — ABNORMAL LOW
Hemoglobin: 8.7 — ABNORMAL LOW
MCHC: 34.3
MCV: 79.7
MCV: 80.3
MCV: 80.6
Platelets: 109 — ABNORMAL LOW
Platelets: 146 — ABNORMAL LOW
RBC: 3.14 — ABNORMAL LOW
RBC: 3.25 — ABNORMAL LOW
RBC: 4.3
RDW: 13.5
RDW: 13.8
RDW: 14
WBC: 5.7
WBC: 8.5

## 2011-05-19 LAB — POCT URINALYSIS DIP (DEVICE)
Bilirubin Urine: NEGATIVE
Nitrite: NEGATIVE
Operator id: 194561
pH: 8.5 — ABNORMAL HIGH

## 2011-05-19 LAB — RPR: RPR Ser Ql: NONREACTIVE

## 2011-05-19 LAB — URINALYSIS, ROUTINE W REFLEX MICROSCOPIC
Bilirubin Urine: NEGATIVE
Ketones, ur: NEGATIVE
Nitrite: NEGATIVE
Specific Gravity, Urine: 1.01
Urobilinogen, UA: 0.2

## 2011-05-19 LAB — STREP B DNA PROBE

## 2011-05-19 LAB — URINE CULTURE: Culture: NO GROWTH

## 2011-05-19 LAB — APTT: aPTT: 33

## 2011-05-19 LAB — POCT URINE PREGNANCY: Preg Test, Ur: NEGATIVE

## 2011-05-19 LAB — CROSSMATCH

## 2011-05-19 LAB — DIFFERENTIAL
Eosinophils Absolute: 0
Eosinophils Relative: 0
Lymphs Abs: 0.8
Monocytes Relative: 6

## 2011-05-19 LAB — COMPREHENSIVE METABOLIC PANEL
Alkaline Phosphatase: 143 — ABNORMAL HIGH
BUN: 8
Calcium: 8.9
Glucose, Bld: 76
Total Protein: 6

## 2011-05-19 LAB — PROTIME-INR: INR: 1

## 2011-05-19 LAB — FIBRINOGEN: Fibrinogen: 453

## 2011-05-19 MED ORDER — ETONOGESTREL 68 MG ~~LOC~~ IMPL
1.0000 | DRUG_IMPLANT | Freq: Once | SUBCUTANEOUS | Status: AC
  Administered 2011-05-19: 1 via SUBCUTANEOUS

## 2011-05-19 NOTE — Progress Notes (Signed)
  Subjective:    Patient ID: [name], female    DOB: 10-23-1990, 20 y.o.   MRN: [id number]  HPI  Here for implanon No questions other than wanting some statistics on prevention of pregnancy which we answered  Review of Systems    Pertinent review of systems: negative for fever or unusual weight change.  Objective:   Physical Exam   GENERAL: Well developed, well nourished, no acute distress LEFT arm PROCEDURE NOTE: Inplanon insertion Patient given informed consent, signed copy in the chart.  Appropriate time out taken  Pregnancy test was negative.  The patient's  Left arm  arm was prepped and draped in the usual sterile fashion.. The ruler used to measure and mark the insertion area 8 cm from medial epicondyle of the elbow. Local anaesthesia obtained using 1.5 cc of 1% lidocaine with epinephrine. Explanon was inserted per manufacturer's directions. Less than 3 cc blood loss. The insertion site covered with antibiotic ointment and a pressure bandage to minimize bruising. There were no complications and the patient tolerated the procedure well.  Device information was given in handout form. Patient is informed the removal date will be in three years.     Assessment & Plan:  Contraceptive management with implanon insertion Patient information given

## 2012-03-19 ENCOUNTER — Ambulatory Visit (INDEPENDENT_AMBULATORY_CARE_PROVIDER_SITE_OTHER): Payer: Medicaid Other | Admitting: Family Medicine

## 2012-03-19 ENCOUNTER — Encounter: Payer: Self-pay | Admitting: Family Medicine

## 2012-03-19 ENCOUNTER — Telehealth: Payer: Self-pay | Admitting: Family Medicine

## 2012-03-19 VITALS — BP 137/73 | HR 78 | Temp 98.5°F | Ht 60.0 in | Wt 116.0 lb

## 2012-03-19 DIAGNOSIS — S0500XA Injury of conjunctiva and corneal abrasion without foreign body, unspecified eye, initial encounter: Secondary | ICD-10-CM

## 2012-03-19 DIAGNOSIS — S058X9A Other injuries of unspecified eye and orbit, initial encounter: Secondary | ICD-10-CM

## 2012-03-19 NOTE — Telephone Encounter (Signed)
Patient has appt tomorrow morning, but will not be able to come.  Requesting appt to be seen today if possible.  Has been using OTC eye drops.  Redness clears up when she uses the drops, but returns about one hour afterwards.  Scheduled appt for today with Dr. Armen Pickup at 2:30pm.  Gaylene Brooks, RN

## 2012-03-19 NOTE — Assessment & Plan Note (Addendum)
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 Plan was for ciprofloxacin drops but patient urgently referred to opthalmology to be seen on same day for complete eval. Due to importance of having patient seen today, visual acuity testing was deferred as well.  Addendum: I spoke with patient by phone after her optho visit. She was prescribed an antibiotic eye drop which she cannot recall the name. She states there was no other intervention required. With the drops. Her blurry vision is starting to improve and her eyes feel better lubricated and less itchy. She is continuing to avoid contact lenses as we discussed at our visit.

## 2012-03-19 NOTE — Progress Notes (Signed)
Subjective:   1. Right eye redness-patient was wearing contacts all day Friday and in the evening noted some irritation and eye pain in her right eye. No known trauma or insult such as rubbing eye. She did nto remove contact for several hours as she didn't have anywhere to put it. Pain 7/10 that has gradually resolved over last few days. Redness has remained in the eye since that time. Patient also complains of blurry vision worse in the morning. No exudate or discharge from the eye. Has had pretty regular tearing from eyeBlurry vision persists during the day. Patient has not been wearing contacts since Friday.   ROS--See HPI  Past Medical History-smoking status noted: nonsmoker.  Reviewed problem list.  Medications- reviewed and updated Chief complaint-noted  Objective:  Gen: NAD HEENT: TM non erythematous non bulging, no rhinorrhea EYE: right eye erythematous throughout cornea and conjunctiva injected. Small white spot over center portion of cornea. Visual fields intact to confrontation. EOMI. PERRLA.  CV: RRR no mrg Lungs: CTAB  Assessment/Plan: See problem oriented charted

## 2012-03-19 NOTE — Telephone Encounter (Signed)
Has appt tomorrow for eye prob and wants to know what she can do in the mean time.  She is using a lot of eye drops and wants to know if it's too much

## 2012-03-19 NOTE — Patient Instructions (Signed)
Dear [name],   It was great to see you today. Thank you for coming to clinic. Please read below regarding the issues that we discussed.   We are going to send you to the eye doctor right now as it appears your cornea may have been scratched by your contact lens.   Sincerely,  Dr. Tana Conch

## 2012-03-20 ENCOUNTER — Encounter: Payer: Self-pay | Admitting: Family Medicine

## 2012-03-20 ENCOUNTER — Ambulatory Visit: Payer: Medicaid Other

## 2012-04-11 ENCOUNTER — Encounter: Payer: Self-pay | Admitting: Family Medicine

## 2012-04-11 DIAGNOSIS — S0500XA Injury of conjunctiva and corneal abrasion without foreign body, unspecified eye, initial encounter: Secondary | ICD-10-CM

## 2012-04-11 NOTE — Assessment & Plan Note (Signed)
Documentation only. Seen at [facility] by Dr. Abel Presto on 07/29. 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 She was treated and was slightly improved at follow-up the next day. She did not return for subsequent follow-up appointment.

## 2012-11-22 ENCOUNTER — Ambulatory Visit: Payer: Medicaid Other | Admitting: Family Medicine

## 2013-11-16 ENCOUNTER — Emergency Department (HOSPITAL_COMMUNITY): Payer: Self-pay

## 2013-11-16 ENCOUNTER — Encounter (HOSPITAL_COMMUNITY): Payer: Self-pay | Admitting: Emergency Medicine

## 2013-11-16 ENCOUNTER — Emergency Department (HOSPITAL_COMMUNITY)
Admission: EM | Admit: 2013-11-16 | Discharge: 2013-11-16 | Disposition: A | Discharge status: Home / Self Care | Payer: Self-pay | Attending: Emergency Medicine | Admitting: Emergency Medicine

## 2013-11-16 DIAGNOSIS — Y92410 Unspecified street and highway as the place of occurrence of the external cause: Secondary | ICD-10-CM | POA: Insufficient documentation

## 2013-11-16 DIAGNOSIS — F1010 Alcohol abuse, uncomplicated: Secondary | ICD-10-CM | POA: Insufficient documentation

## 2013-11-16 DIAGNOSIS — Y9389 Activity, other specified: Secondary | ICD-10-CM | POA: Insufficient documentation

## 2013-11-16 DIAGNOSIS — M549 Dorsalgia, unspecified: Secondary | ICD-10-CM

## 2013-11-16 DIAGNOSIS — IMO0002 Reserved for concepts with insufficient information to code with codable children: Secondary | ICD-10-CM | POA: Insufficient documentation

## 2013-11-16 DIAGNOSIS — Z8742 Personal history of other diseases of the female genital tract: Secondary | ICD-10-CM | POA: Insufficient documentation

## 2013-11-16 DIAGNOSIS — S058X9A Other injuries of unspecified eye and orbit, initial encounter: Secondary | ICD-10-CM | POA: Insufficient documentation

## 2013-11-16 DIAGNOSIS — S01111A Laceration without foreign body of right eyelid and periocular area, initial encounter: Secondary | ICD-10-CM

## 2013-11-16 DIAGNOSIS — Z8632 Personal history of gestational diabetes: Secondary | ICD-10-CM | POA: Insufficient documentation

## 2013-11-16 MED ORDER — ACETAMINOPHEN 325 MG PO TABS
650.0000 mg | ORAL_TABLET | Freq: Once | ORAL | Status: AC
  Administered 2013-11-16: 650 mg via ORAL
  Filled 2013-11-16: qty 2

## 2013-11-16 NOTE — ED Notes (Signed)
MD at bedside. 

## 2013-11-16 NOTE — ED Notes (Signed)
Dr Zavitz at bedside  
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
## 2013-11-16 NOTE — Discharge Instructions (Signed)
If you were given medicines take as directed.  If you are on coumadin or contraceptives realize their levels and effectiveness is altered by many different medicines.  If you have any reaction (rash, tongues swelling, other) to the medicines stop taking and see a physician.   Please follow up as directed and return to the ER or see a physician for new or worsening symptoms.  Thank you. Filed Vitals:   11/16/13 0102 11/16/13 0206 11/16/13 0401  BP: 92/58 104/53 102/63  Pulse: 83 75 84  Temp: 98.3 F (36.8 C) 98 F (36.7 C)   TempSrc: Oral Oral   Resp: 18 18   Height: 5' (1.524 m)    Weight: 120 lb (54.432 kg)    SpO2: 100% 100% 100%
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
## 2014-06-02 ENCOUNTER — Ambulatory Visit (INDEPENDENT_AMBULATORY_CARE_PROVIDER_SITE_OTHER): Payer: Self-pay | Admitting: *Deleted

## 2014-06-02 DIAGNOSIS — Z23 Encounter for immunization: Secondary | ICD-10-CM

## 2014-06-12 ENCOUNTER — Ambulatory Visit: Payer: Medicaid Other

## 2015-01-22 ENCOUNTER — Ambulatory Visit (INDEPENDENT_AMBULATORY_CARE_PROVIDER_SITE_OTHER): Payer: Medicaid Other | Admitting: Family Medicine

## 2015-01-22 ENCOUNTER — Encounter: Payer: Self-pay | Admitting: Family Medicine

## 2015-01-22 VITALS — BP 102/61 | HR 69 | Temp 98.6°F | Ht 61.0 in | Wt 119.9 lb

## 2015-01-22 DIAGNOSIS — Z309 Encounter for contraceptive management, unspecified: Secondary | ICD-10-CM | POA: Diagnosis not present

## 2015-01-22 DIAGNOSIS — Z30019 Encounter for initial prescription of contraceptives, unspecified: Secondary | ICD-10-CM | POA: Diagnosis not present

## 2015-01-22 DIAGNOSIS — Z30017 Encounter for initial prescription of implantable subdermal contraceptive: Secondary | ICD-10-CM

## 2015-01-22 DIAGNOSIS — Z308 Encounter for other contraceptive management: Secondary | ICD-10-CM

## 2015-01-22 DIAGNOSIS — Z3046 Encounter for surveillance of implantable subdermal contraceptive: Secondary | ICD-10-CM

## 2015-01-22 LAB — POCT URINE PREGNANCY: PREG TEST UR: NEGATIVE

## 2015-01-22 MED ORDER — ETONOGESTREL 68 MG ~~LOC~~ IMPL
68.0000 mg | DRUG_IMPLANT | Freq: Once | SUBCUTANEOUS | Status: AC
  Administered 2015-01-22: 68 mg via SUBCUTANEOUS

## 2015-01-22 NOTE — Patient Instructions (Signed)
Etonogestrel implant What is this medicine? ETONOGESTREL (et oh noe JES trel) is a contraceptive (birth control) device. It is used to prevent pregnancy. It can be used for up to 3 years. This medicine may be used for other purposes; ask your health care provider or pharmacist if you have questions. COMMON BRAND NAME(S): Implanon, Nexplanon What should I tell my health care provider before I take this medicine? They need to know if you have any of these conditions: -abnormal vaginal bleeding -blood vessel disease or blood clots -cancer of the breast, cervix, or liver -depression -diabetes -gallbladder disease -headaches -heart disease or recent heart attack -high blood pressure -high cholesterol -kidney disease -liver disease -renal disease -seizures -tobacco smoker -an unusual or allergic reaction to etonogestrel, other hormones, anesthetics or antiseptics, medicines, foods, dyes, or preservatives -pregnant or trying to get pregnant -breast-feeding How should I use this medicine? This device is inserted just under the skin on the inner side of your upper arm by a health care professional. Talk to your pediatrician regarding the use of this medicine in children. Special care may be needed. Overdosage: If you think you've taken too much of this medicine contact a poison control center or emergency room at once. Overdosage: If you think you have taken too much of this medicine contact a poison control center or emergency room at once. NOTE: This medicine is only for you. Do not share this medicine with others. What if I miss a dose? This does not apply. What may interact with this medicine? Do not take this medicine with any of the following medications: -amprenavir -bosentan -fosamprenavir This medicine may also interact with the following medications: -barbiturate medicines for inducing sleep or treating seizures -certain medicines for fungal infections like ketoconazole and  itraconazole -griseofulvin -medicines to treat seizures like carbamazepine, felbamate, oxcarbazepine, phenytoin, topiramate -modafinil -phenylbutazone -rifampin -some medicines to treat HIV infection like atazanavir, indinavir, lopinavir, nelfinavir, tipranavir, ritonavir -St. John's wort This list may not describe all possible interactions. Give your health care provider a list of all the medicines, herbs, non-prescription drugs, or dietary supplements you use. Also tell them if you smoke, drink alcohol, or use illegal drugs. Some items may interact with your medicine. What should I watch for while using this medicine? This product does not protect you against HIV infection (AIDS) or other sexually transmitted diseases. You should be able to feel the implant by pressing your fingertips over the skin where it was inserted. Tell your doctor if you cannot feel the implant. What side effects may I notice from receiving this medicine? Side effects that you should report to your doctor or health care professional as soon as possible: -allergic reactions like skin rash, itching or hives, swelling of the face, lips, or tongue -breast lumps -changes in vision -confusion, trouble speaking or understanding -dark urine -depressed mood -general ill feeling or flu-like symptoms -light-colored stools -loss of appetite, nausea -right upper belly pain -severe headaches -severe pain, swelling, or tenderness in the abdomen -shortness of breath, chest pain, swelling in a leg -signs of pregnancy -sudden numbness or weakness of the face, arm or leg -trouble walking, dizziness, loss of balance or coordination -unusual vaginal bleeding, discharge -unusually weak or tired -yellowing of the eyes or skin Side effects that usually do not require medical attention (Report these to your doctor or health care professional if they continue or are bothersome.): -acne -breast pain -changes in  weight -cough -fever or chills -headache -irregular menstrual bleeding -itching, burning, and   vaginal discharge -pain or difficulty passing urine -sore throat This list may not describe all possible side effects. Call your doctor for medical advice about side effects. You may report side effects to FDA at 1-800-FDA-1088. Where should I keep my medicine? This drug is given in a hospital or clinic and will not be stored at home. NOTE: This sheet is a summary. It may not cover all possible information. If you have questions about this medicine, talk to your doctor, pharmacist, or health care provider.  2015, [employer]vier/Gold Standard. (2012-02-13 15:37:45)  

## 2015-01-22 NOTE — Progress Notes (Addendum)
Patient ID: Christine SchimkeHien Daugherty, female   DOB: 12/21/1990, 24 y.o.   MRN: [id number] PRE-OP DIAGNOSIS: desired long-term, reversible contraception. Here for Nexplanon removal and reinsertion. Her current Nexplanon expired 8 months ago, she did not change it then due to insurance issue.  POST-OP DIAGNOSIS: Nexplanon removal and insertion.  PROCEDURE: Nexplanon removal and placement   Performing Physician: Janit PaganKehinde Mika Griffitts, MD, MPH  Checklist: Pregnancy test done today negative.                  Multiple Partners:No    Copper Allergy : No  PID/STD: No  PROCEDURE:  Timeout procedure was performed to ensure right patient and right site. Informed consent obtained.  Nexplanon device palpable on her left arm.  The patient was placed in a supine position and the left arm was marked with a pen at the insertion site 6-8 cm above the medical epicondyle in the groove between the triceps and biceps.   The insertion site was cleaned with an antiseptic. 5 mLs of anesthetic was place along the insertion track.  A small incision was made at the nexplanon insertion site and the whole Nexplanon device was removed with minimal bleed.  Nexplanon insertion process began soon after removal.  The skin was stretched and the wide bore needle on the inserter entered the skin at a 20 degree angle.   The inserter was lowered along the plane of the skin and then lifted and tented as the needle was inserted to its full length.   The device was deployed and removed. Placement of the rod was confirmed by palpation.   A  pressure dressing was placed over the insertion site.   The patient did tolerate the procedure well.    Nexplanon Device: Lot #: [id number].

## 2015-08-13 ENCOUNTER — Encounter (HOSPITAL_COMMUNITY): Payer: Self-pay | Admitting: *Deleted

## 2015-08-13 ENCOUNTER — Emergency Department (HOSPITAL_COMMUNITY)
Admission: EM | Admit: 2015-08-13 | Discharge: 2015-08-13 | Disposition: A | Discharge status: Home / Self Care | Payer: Medicaid Other | Attending: Emergency Medicine | Admitting: Emergency Medicine

## 2015-08-13 DIAGNOSIS — Z8632 Personal history of gestational diabetes: Secondary | ICD-10-CM | POA: Insufficient documentation

## 2015-08-13 DIAGNOSIS — K219 Gastro-esophageal reflux disease without esophagitis: Secondary | ICD-10-CM | POA: Insufficient documentation

## 2015-08-13 MED ORDER — GI COCKTAIL ~~LOC~~: 30.0000 mL | Freq: Once | ORAL | Status: DC

## 2015-08-13 MED ORDER — RANITIDINE HCL 150 MG PO CAPS: 150.0000 mg | ORAL_CAPSULE | Freq: Every day | ORAL | Status: DC

## 2015-08-13 MED ORDER — GI COCKTAIL ~~LOC~~
30.0000 mL | Freq: Once | ORAL | Status: AC
  Administered 2015-08-13: 30 mL via ORAL
  Filled 2015-08-13: qty 30

## 2015-08-13 NOTE — ED Notes (Signed)
Pt verbalizes understanding of instructions. Ambulates with steady gait to discharge.

## 2015-08-13 NOTE — ED Notes (Signed)
Pt presents via POV c/o reflux beginning this AM, reports she doesn't get this often, approximately 1x a year.  Denies taking anything at home, reports nothing helps.  Reports being given a GI cocktail in the past with relief.  Pt a x 4, NAD.

## 2015-08-13 NOTE — Discharge Instructions (Signed)

## 2015-08-13 NOTE — ED Provider Notes (Signed)
CSN: [id number]     Arrival date & time 08/13/15  [id number] History   First MD Initiated Contact with Patient 08/13/15 0957     Chief Complaint  Patient presents with  . Gastroesophageal Reflux     (Consider location/radiation/quality/duration/timing/severity/associated sxs/prior Treatment) HPI Comments: Patient presents with reflux symptoms. She states she's had a history of acid reflux when time before. She states these are the same type symptoms. She has some burning in her stomach and up into her esophagus. It started this morning. She hasn't taken any medications at home. She had one episode of vomiting. She denies any chest pain or shortness of breath. There is no hematemesis. Eyes any change in bowel habits or blood in her stool.  Patient is a 24 y.o. female presenting with GERD.  Gastroesophageal Reflux Pertinent negatives include no chest pain, no abdominal pain, no headaches and no shortness of breath.    Past Medical History  Diagnosis Date  . DIABETES MELLITUS, GESTATIONAL, BORDERLINE 11/26/2007    Qualifier: History of  By: [name]  MD, Judeth CornfieldStephanie    . PLACENTA ABRUPTIO 06/25/2010    Qualifier: Diagnosis of  By: [name] MD, [name]     History reviewed. No pertinent past surgical history. No family history on file. Social History  Substance Use Topics  . Smoking status: Never Smoker   . Smokeless tobacco: None  . Alcohol Use: Yes   OB History    No data available     Review of Systems  Constitutional: Negative for fever, chills, diaphoresis and fatigue.  HENT: Negative for congestion, rhinorrhea and sneezing.   Eyes: Negative.   Respiratory: Negative for cough, chest tightness and shortness of breath.   Cardiovascular: Negative for chest pain and leg swelling.  Gastrointestinal: Positive for nausea and vomiting (one time). Negative for abdominal pain, diarrhea and blood in stool.  Genitourinary: Negative for frequency, hematuria, flank pain and difficulty urinating.   Musculoskeletal: Negative for back pain and arthralgias.  Skin: Negative for rash.  Neurological: Negative for dizziness, speech difficulty, weakness, numbness and headaches.      Allergies  Review of patient's allergies indicates no known allergies.  Home Medications   Prior to Admission medications   Medication Sig Start Date End Date Taking? Authorizing Provider  ranitidine (ZANTAC) 150 MG capsule Take 1 capsule (150 mg total) by mouth daily. 08/13/15   Rolan BuccoMelanie Marialuisa Basara, MD   BP 106/65 mmHg  Pulse 65  Temp(Src) 97.6 F (36.4 C) (Oral)  Resp 18  SpO2 100%  LMP 08/13/2015 Physical Exam  Constitutional: She is oriented to person, place, and time. She appears well-developed and well-nourished.  HENT:  Head: Normocephalic and atraumatic.  Eyes: Pupils are equal, round, and reactive to light.  Neck: Normal range of motion. Neck supple.  Cardiovascular: Normal rate, regular rhythm and normal heart sounds.   Pulmonary/Chest: Effort normal and breath sounds normal. No respiratory distress. She has no wheezes. She has no rales. She exhibits no tenderness.  Abdominal: Soft. Bowel sounds are normal. There is no tenderness. There is no rebound and no guarding.  Musculoskeletal: Normal range of motion. She exhibits no edema.  Lymphadenopathy:    She has no cervical adenopathy.  Neurological: She is alert and oriented to person, place, and time.  Skin: Skin is warm and dry. No rash noted.  Psychiatric: She has a normal mood and affect.    ED Course  Procedures (including critical care time) Labs Review Labs Reviewed - No data to display  Imaging Review No results found. I have personally reviewed and evaluated these images and lab results as part of my medical decision-making.   EKG Interpretation None      MDM   Final diagnoses:  Gastroesophageal reflux disease without esophagitis    Patient presents with symptoms consistent with reflux. She has no abdominal pain. No  cardiac symptoms. She improved after GI cocktail. She was discharged home in good condition. She was encouraged to use a bland diet. She was advised to return if she has any worsening symptoms. She is advised to follow-up with her PCP.    Rolan Bucco, MD 08/13/15 [phone]

## 2015-08-14 ENCOUNTER — Encounter (HOSPITAL_COMMUNITY): Payer: Self-pay | Admitting: *Deleted

## 2015-08-14 ENCOUNTER — Inpatient Hospital Stay (HOSPITAL_COMMUNITY)
Admission: EM | Admit: 2015-08-14 | Discharge: 2015-08-19 | DRG: 419 | Disposition: A | Discharge status: Home / Self Care | Payer: Self-pay | Attending: Internal Medicine | Admitting: Internal Medicine

## 2015-08-14 ENCOUNTER — Emergency Department (HOSPITAL_COMMUNITY): Payer: Self-pay

## 2015-08-14 DIAGNOSIS — Z419 Encounter for procedure for purposes other than remedying health state, unspecified: Secondary | ICD-10-CM

## 2015-08-14 DIAGNOSIS — K8070 Calculus of gallbladder and bile duct without cholecystitis without obstruction: Secondary | ICD-10-CM | POA: Diagnosis present

## 2015-08-14 DIAGNOSIS — K851 Biliary acute pancreatitis without necrosis or infection: Principal | ICD-10-CM | POA: Diagnosis present

## 2015-08-14 DIAGNOSIS — K8051 Calculus of bile duct without cholangitis or cholecystitis with obstruction: Secondary | ICD-10-CM

## 2015-08-14 DIAGNOSIS — Z79899 Other long term (current) drug therapy: Secondary | ICD-10-CM

## 2015-08-14 DIAGNOSIS — K859 Acute pancreatitis without necrosis or infection, unspecified: Secondary | ICD-10-CM | POA: Insufficient documentation

## 2015-08-14 LAB — CBC WITH DIFFERENTIAL/PLATELET
BASOS ABS: 0 10*3/uL (ref 0.0–0.1)
BASOS ABS: 0 10*3/uL (ref 0.0–0.1)
BASOS PCT: 0 %
BASOS PCT: 1 %
EOS ABS: 0 10*3/uL (ref 0.0–0.7)
Eosinophils Absolute: 0 10*3/uL (ref 0.0–0.7)
Eosinophils Relative: 1 %
Eosinophils Relative: 1 %
HEMATOCRIT: 37.7 % (ref 36.0–46.0)
HEMATOCRIT: 34.2 % — AB (ref 36.0–46.0)
HEMOGLOBIN: 12.3 g/dL (ref 12.0–15.0)
Hemoglobin: 10.9 g/dL — ABNORMAL LOW (ref 12.0–15.0)
Lymphocytes Relative: 18 %
Lymphocytes Relative: 32 %
Lymphs Abs: 1.3 10*3/uL (ref 0.7–4.0)
Lymphs Abs: 1.6 10*3/uL (ref 0.7–4.0)
MCH: 25.5 pg — ABNORMAL LOW (ref 26.0–34.0)
MCH: 25.1 pg — ABNORMAL LOW (ref 26.0–34.0)
MCHC: 32.6 g/dL (ref 30.0–36.0)
MCHC: 31.9 g/dL (ref 30.0–36.0)
MCV: 78.2 fL (ref 78.0–100.0)
MCV: 78.8 fL (ref 78.0–100.0)
MONO ABS: 0.5 10*3/uL (ref 0.1–1.0)
MONO ABS: 0.3 10*3/uL (ref 0.1–1.0)
MONOS PCT: 7 %
Monocytes Relative: 6 %
NEUTROS ABS: 5.3 10*3/uL (ref 1.7–7.7)
NEUTROS ABS: 3.1 10*3/uL (ref 1.7–7.7)
NEUTROS PCT: 74 %
NEUTROS PCT: 60 %
Platelets: 194 10*3/uL (ref 150–400)
Platelets: 170 10*3/uL (ref 150–400)
RBC: 4.82 MIL/uL (ref 3.87–5.11)
RBC: 4.34 MIL/uL (ref 3.87–5.11)
RDW: 13.7 % (ref 11.5–15.5)
RDW: 13.8 % (ref 11.5–15.5)
WBC: 7.1 10*3/uL (ref 4.0–10.5)
WBC: 5.1 10*3/uL (ref 4.0–10.5)

## 2015-08-14 LAB — HEPATIC FUNCTION PANEL
ALT: 247 U/L — ABNORMAL HIGH (ref 14–54)
AST: 203 U/L — ABNORMAL HIGH (ref 15–41)
Albumin: 3.3 g/dL — ABNORMAL LOW (ref 3.5–5.0)
Alkaline Phosphatase: 54 U/L (ref 38–126)
BILIRUBIN DIRECT: 0.9 mg/dL — AB (ref 0.1–0.5)
BILIRUBIN INDIRECT: 1.2 mg/dL — AB (ref 0.3–0.9)
Total Bilirubin: 2.1 mg/dL — ABNORMAL HIGH (ref 0.3–1.2)
Total Protein: 6.1 g/dL — ABNORMAL LOW (ref 6.5–8.1)

## 2015-08-14 LAB — COMPREHENSIVE METABOLIC PANEL
ALBUMIN: 4.1 g/dL (ref 3.5–5.0)
ALT: 291 U/L — ABNORMAL HIGH (ref 14–54)
ANION GAP: 11 (ref 5–15)
AST: 330 U/L — AB (ref 15–41)
Alkaline Phosphatase: 61 U/L (ref 38–126)
BUN: 8 mg/dL (ref 6–20)
CALCIUM: 9.1 mg/dL (ref 8.9–10.3)
CO2: 24 mmol/L (ref 22–32)
Chloride: 104 mmol/L (ref 101–111)
Creatinine, Ser: 0.97 mg/dL (ref 0.44–1.00)
GFR calc Af Amer: >60 mL/min (ref >60)
GFR calc non Af Amer: >60 mL/min (ref >60)
GLUCOSE: 115 mg/dL — AB (ref 65–99)
POTASSIUM: 3.6 mmol/L (ref 3.5–5.1)
SODIUM: 139 mmol/L (ref 135–145)
TOTAL PROTEIN: 7.2 g/dL (ref 6.5–8.1)
Total Bilirubin: 2.8 mg/dL — ABNORMAL HIGH (ref 0.3–1.2)

## 2015-08-14 LAB — I-STAT BETA HCG BLOOD, ED (MC, WL, AP ONLY)

## 2015-08-14 LAB — BASIC METABOLIC PANEL
ANION GAP: 5 (ref 5–15)
BUN: 8 mg/dL (ref 6–20)
CALCIUM: 8.3 mg/dL — AB (ref 8.9–10.3)
CO2: 24 mmol/L (ref 22–32)
Chloride: 109 mmol/L (ref 101–111)
Creatinine, Ser: 0.81 mg/dL (ref 0.44–1.00)
GFR calc Af Amer: >60 mL/min (ref >60)
GLUCOSE: 120 mg/dL — AB (ref 65–99)
Potassium: 3.9 mmol/L (ref 3.5–5.1)
Sodium: 138 mmol/L (ref 135–145)

## 2015-08-14 LAB — GLUCOSE, CAPILLARY
GLUCOSE-CAPILLARY: 68 mg/dL (ref 65–99)
Glucose-Capillary: 97 mg/dL (ref 65–99)
Glucose-Capillary: 66 mg/dL (ref 65–99)

## 2015-08-14 LAB — LIPASE, BLOOD

## 2015-08-14 MED ORDER — ENOXAPARIN SODIUM 30 MG/0.3ML ~~LOC~~ SOLN
30.0000 mg | SUBCUTANEOUS | Status: DC
  Filled 2015-08-14: qty 0.3

## 2015-08-14 MED ORDER — SODIUM CHLORIDE 0.9 % IV BOLUS (SEPSIS)
1000.0000 mL | Freq: Once | INTRAVENOUS | Status: AC
  Administered 2015-08-14: 1000 mL via INTRAVENOUS

## 2015-08-14 MED ORDER — ONDANSETRON HCL 4 MG/2ML IJ SOLN: 4.0000 mg | Freq: Three times a day (TID) | INTRAMUSCULAR | Status: DC | PRN

## 2015-08-14 MED ORDER — ONDANSETRON 4 MG PO TBDP
4.0000 mg | ORAL_TABLET | Freq: Once | ORAL | Status: AC
  Administered 2015-08-14: 4 mg via ORAL
  Filled 2015-08-14: qty 1

## 2015-08-14 MED ORDER — MORPHINE SULFATE (PF) 4 MG/ML IV SOLN
4.0000 mg | Freq: Once | INTRAVENOUS | Status: AC
  Administered 2015-08-14: 4 mg via INTRAVENOUS
  Filled 2015-08-14: qty 1

## 2015-08-14 MED ORDER — DEXTROSE 50 % IV SOLN
INTRAVENOUS | Status: AC
  Administered 2015-08-14: 50 mL
  Filled 2015-08-14: qty 50

## 2015-08-14 MED ORDER — GI COCKTAIL ~~LOC~~
30.0000 mL | Freq: Once | ORAL | Status: AC
  Administered 2015-08-14: 30 mL via ORAL
  Filled 2015-08-14: qty 30

## 2015-08-14 MED ORDER — PIPERACILLIN-TAZOBACTAM 3.375 G IVPB
3.3750 g | Freq: Three times a day (TID) | INTRAVENOUS | Status: DC
  Administered 2015-08-14 – 2015-08-18 (×13): 3.375 g via INTRAVENOUS
  Filled 2015-08-14 (×16): qty 50

## 2015-08-14 MED ORDER — DEXTROSE 50 % IV SOLN
INTRAVENOUS | Status: AC
  Filled 2015-08-14: qty 50

## 2015-08-14 MED ORDER — MORPHINE SULFATE (PF) 2 MG/ML IV SOLN
1.0000 mg | INTRAVENOUS | Status: DC | PRN
  Administered 2015-08-14 – 2015-08-19 (×4): 1 mg via INTRAVENOUS
  Filled 2015-08-14 (×5): qty 1

## 2015-08-14 MED ORDER — SODIUM CHLORIDE 0.9 % IV SOLN: INTRAVENOUS | Status: DC

## 2015-08-14 MED ORDER — SODIUM CHLORIDE 0.9 % IV SOLN
INTRAVENOUS | Status: AC
  Administered 2015-08-14 – 2015-08-15 (×2): via INTRAVENOUS

## 2015-08-14 MED ORDER — ONDANSETRON HCL 4 MG PO TABS: 4.0000 mg | ORAL_TABLET | Freq: Four times a day (QID) | ORAL | Status: DC | PRN

## 2015-08-14 MED ORDER — INFLUENZA VAC SPLIT QUAD 0.5 ML IM SUSY
0.5000 mL | PREFILLED_SYRINGE | INTRAMUSCULAR | Status: DC
  Filled 2015-08-14: qty 0.5

## 2015-08-14 MED ORDER — HYDROMORPHONE HCL 1 MG/ML IJ SOLN: 0.5000 mg | INTRAMUSCULAR | Status: DC | PRN

## 2015-08-14 MED ORDER — ONDANSETRON HCL 4 MG/2ML IJ SOLN
4.0000 mg | Freq: Four times a day (QID) | INTRAMUSCULAR | Status: DC | PRN
  Administered 2015-08-14 – 2015-08-18 (×2): 4 mg via INTRAVENOUS
  Filled 2015-08-14 (×2): qty 2

## 2015-08-14 NOTE — Progress Notes (Signed)
Patient Demographics  [name], is a 24 y.o. female, DOB - 08/05/1991, ZOX:[id number]  Admit date - 08/14/2015   Admitting Physician Eduard Clos, MD  Outpatient Primary MD for the patient is Tawni Carnes, MD  LOS - 0   Chief Complaint  Patient presents with  . Chest Pain         Subjective:   [name] today has, No headache, No chest pain, No Cough - SOB, complaints of mild abdominal pain, no vomiting.  Assessment & Plan    Principal Problem:   Gallstone pancreatitis  Gallstone pancreatitis - Gastroenterology consult greatly appreciated, continue with aggressive IV fluid hydration, keep nothing by mouth, continue with when necessary pain and nausea medication, is significantly elevated, LFTs trending down, will need ERCP to clear her CAPD once her pancreatitis improve.  Code Status: Full  Family Communication: none   Disposition Plan:home when stable   Procedures  none   Consults   GI   Medications  Scheduled Meds: . [START ON 08/15/2015] Influenza vac split quadrivalent PF  0.5 mL Intramuscular Tomorrow-1000  . piperacillin-tazobactam (ZOSYN)  IV  3.375 g Intravenous 3 times per day   Continuous Infusions: . sodium chloride 75 mL/hr at 08/14/15 0621   PRN Meds:.morphine injection, ondansetron **OR** ondansetron (ZOFRAN) IV  DVT Prophylaxis  Lovenox   Lab Results  Component Value Date   PLT 170 08/14/2015    Antibiotics    Anti-infectives    Start     Dose/Rate Route Frequency Ordered Stop   08/14/15 0645  piperacillin-tazobactam (ZOSYN) IVPB 3.375 g     3.375 g 12.5 mL/hr over 240 Minutes Intravenous 3 times per day 08/14/15 0619            Objective:   Filed Vitals:   08/14/15 0430 08/14/15 0530 08/14/15 0610 08/14/15 1032  BP: 100/56 100/67 104/43 90/52  Pulse: 65 57 55 54  Temp:   98.1 F (36.7 C) 98.8 F (37.1 C)  TempSrc:   Oral  Oral  Resp: Height:   5' (1.524 m)   Weight:   53.7 kg (118 lb 6.2 oz)   SpO2: 99% 98% 100% 100%    Wt Readings from Last 3 Encounters:  08/14/15 53.7 kg (118 lb 6.2 oz)  01/22/15 54.386 kg (119 lb 14.4 oz)  11/16/13 54.432 kg (120 lb)     Intake/Output Summary (Last 24 hours) at 08/14/15 1639 Last data filed at 08/14/15 0418  Gross per 24 hour  Intake   1000 ml  Output      0 ml  Net   1000 ml     Physical Exam  Awake Alert, Oriented X 3, No new F.N deficits, Normal affect [place].AT,PERRAL Supple Neck,No JVD, No cervical lymphadenopathy appriciated.  Symmetrical Chest wall movement, Good air movement bilaterally, CTAB RRR,No Gallops,Rubs or new Murmurs, No Parasternal Heave +ve B.Sounds, Abd Soft, mild diffuse tenderness, No organomegaly appriciated, No rebound - guarding or rigidity. No Cyanosis, Clubbing or edema, No new Rash or bruise     Data Review   Micro Results No results found for this or any previous visit (from the past 240 hour(s)).  Radiology Reports US Abdomen Limited Ruq  08/14/2015  CLINICAL DATA:  Epigastric pain for 1 day EXAM: US ABDOMEN LIMITED - RIGHT UPPER QUADRANT COMPARISON:  None. FINDINGS: Gallbladder: Numerous layering gallstones. There is also a gallstone fixed within the gallbladder neck. There is no wall thickening or focal tenderness to suggest acute cholecystitis. Pericholecystic and peripancreatic edema. Bile ducts: Common bile duct measures 6 mm; there is mild diffuse intrahepatic ductal dilatation. Non shadowing but still convincing 4 mm defect within the distal common bile duct. Liver: No focal lesion identified. Within normal limits in parenchymal echogenicity. IMPRESSION: 1. Cholelithiasis including stone fixed in the gallbladder neck. 2. Distal CBD stone and biliary dilatation. 3. Pancreatitis. Electronically Signed   By: Marnee SpringJonathon  Watts M.D.   On: 08/14/2015 04:42     CBC  Recent Labs Lab 08/14/15 0117 08/14/15 0730    WBC 7.1 5.1  HGB 12.3 10.9*  HCT 37.7 34.2*  PLT 194 170  MCV 78.2 78.8  MCH 25.5* 25.1*  MCHC 32.6 31.9  RDW 13.7 13.8  LYMPHSABS 1.3 1.6  MONOABS 0.5 0.3  EOSABS 0.0 0.0  BASOSABS 0.0 0.0    Chemistries   Recent Labs Lab 08/14/15 0117 08/14/15 0730  NA 139 138  K 3.6 3.9  CL 104 109  CO2 24 24  GLUCOSE 115* 120*  BUN 8 8  CREATININE 0.97 0.81  CALCIUM 9.1 8.3*  AST 330* 203*  ALT 291* 247*  ALKPHOS 61 54  BILITOT 2.8* 2.1*   ------------------------------------------------------------------------------------------------------------------ estimated creatinine clearance is 75.6 mL/min (by C-G formula based on Cr of 0.81). ------------------------------------------------------------------------------------------------------------------ No results for input(s): HGBA1C in the last 72 hours. ------------------------------------------------------------------------------------------------------------------ No results for input(s): CHOL, HDL, LDLCALC, TRIG, CHOLHDL, LDLDIRECT in the last 72 hours. ------------------------------------------------------------------------------------------------------------------ No results for input(s): TSH, T4TOTAL, T3FREE, THYROIDAB in the last 72 hours.  Invalid input(s): FREET3 ------------------------------------------------------------------------------------------------------------------ No results for input(s): VITAMINB12, FOLATE, FERRITIN, TIBC, IRON, RETICCTPCT in the last 72 hours.  Coagulation profile No results for input(s): INR, PROTIME in the last 168 hours.  No results for input(s): DDIMER in the last 72 hours.  Cardiac Enzymes No results for input(s): CKMB, TROPONINI, MYOGLOBIN in the last 168 hours.  Invalid input(s): CK ------------------------------------------------------------------------------------------------------------------ Invalid input(s): POCBNP     Time Spent in minutes   No charge   Randol KernELGERGAWY,  Jaquetta Currier M.D on 08/14/2015 at 4:39 PM  Between 7am to 7pm - Pager - [phone]  After 7pm go to [email or web address] - password [email or web address]  [facility]   Office  [phone]

## 2015-08-14 NOTE — Progress Notes (Signed)
New Admission Note:  Arrival Method: via stretcher with nurse Tech Mental Orientation: Alert & oriented x4 Telemetry: n/a Assessment: Completed Skin: dry and intact IV: Right hand Pain: 5/10, see doc flowsheet  Tubes: n/a Safety Measures: Safety Fall Prevention Plan was given, discussed and signed. Admission: initiated [address] [place] Orientation: Patient has been orientated to the room, unit and the staff. Family: none at bedside  Orders have been reviewed and implemented. Will continue to monitor the patient. Call light has been placed within reach and bed alarm has been activated.   Tempie DonningMercy Brizeida Mcmurry BSN, RN  Phone Number: [phone] [facility] [place] [facility]

## 2015-08-14 NOTE — ED Notes (Signed)
Patient transported to Ultrasound 

## 2015-08-14 NOTE — ED Notes (Signed)
The pt was here eaerlier today with chest pain and diagnosed with gerd.  She has also had the same symptoms numerus times in the past.  This time the pain is no better  lmp now

## 2015-08-14 NOTE — H&P (Signed)
[facility] History and Physical  [name] ZOX:[id number] DOB: 11/18/1990 DOA: 08/14/2015  Referring physician: Dr.Liu. PCP: Christine Carnes, MD  Specialists: None.  Chief Complaint: Abdominal pain.  HPI: [name] is a 24 y.o. female with no significant past medical history presents to the ER because of abdominal pain. This is the second visit to the ER for the same complaint. Pain is mostly in the right upper quadrant and epigastric area associated with multiple episodes of nausea vomiting. In the ER patient's lipase is more than 3000 and LFTs are elevated. Sonogram showing gallbladder stones and distal CBD stone. ER physician had discussed with on-call gastroenterologist for [facility] physicians who will be seeing patient in consult for possible ERCP. Patient denies any fever chills chest pain or shortness of breath.   Review of Systems: As presented in the history of presenting illness, rest negative.  Past Medical History  Diagnosis Date  . DIABETES MELLITUS, GESTATIONAL, BORDERLINE 11/26/2007    Qualifier: History of  By: Sandi Mealy  MD, Judeth Cornfield    . PLACENTA ABRUPTIO 06/25/2010    Qualifier: Diagnosis of  By: Madolyn Frieze MD, Marylene Land     History reviewed. No pertinent past surgical history. Social History:  reports that she has never smoked. She does not have any smokeless tobacco history on file. She reports that she drinks alcohol. She reports that she does not use illicit drugs. Where does patient live home. Can patient participate in ADLs? Yes.  No Known Allergies  Family History:  Family History  Problem Relation Age of Onset  . Hypertension Other       Prior to Admission medications   Medication Sig Start Date End Date Taking? Authorizing Provider  ranitidine (ZANTAC) 150 MG capsule Take 1 capsule (150 mg total) by mouth daily. 08/13/15  Yes Rolan Bucco, MD    Physical Exam: Filed Vitals:   08/14/15 0245 08/14/15 0300 08/14/15 0330 08/14/15 0430  BP: 116/77 112/65 102/61  100/56  Pulse: 63 62 72 65  Temp:      Resp: SpO2: 98% 96% 97% 99%     General:  Moderately built and nourished.  Eyes: Anicteric no pallor.  ENT: No discharge from the ears eyes nose and mouth.  Neck: No mass felt. No neck rigidity.  Cardiovascular: S1-S2 heard.  Respiratory: No rhonchi or crepitations.  Abdomen: Mild tenderness in the right upper quadrant and epigastric area.  Skin: No rash.  Musculoskeletal: No edema.  Psychiatric: Appears normal.  Neurologic: Alert awake oriented to time place and person. Moves all extremities.  Labs on Admission:  Basic Metabolic Panel:  Recent Labs Lab 08/14/15 0117  NA 139  K 3.6  CL 104  CO2 24  GLUCOSE 115*  BUN 8  CREATININE 0.97  CALCIUM 9.1   Liver Function Tests:  Recent Labs Lab 08/14/15 0117  AST 330*  ALT 291*  ALKPHOS 61  BILITOT 2.8*  PROT 7.2  ALBUMIN 4.1    Recent Labs Lab 08/14/15 0117  LIPASE >3000*   No results for input(s): AMMONIA in the last 168 hours. CBC:  Recent Labs Lab 08/14/15 0117  WBC 7.1  NEUTROABS 5.3  HGB 12.3  HCT 37.7  MCV 78.2  PLT 194   Cardiac Enzymes: No results for input(s): CKTOTAL, CKMB, CKMBINDEX, TROPONINI in the last 168 hours.  BNP (last 3 results) No results for input(s): BNP in the last 8760 hours.  ProBNP (last 3 results) No results for input(s): PROBNP in the  last 8760 hours.  CBG: No results for input(s): GLUCAP in the last 168 hours.  Radiological Exams on Admission: [place] Abdomen Limited Ruq  08/14/2015  CLINICAL DATA:  Epigastric pain for 1 day EXAM: US ABDOMEN LIMITED - RIGHT UPPER QUADRANT COMPARISON:  None. FINDINGS: Gallbladder: Numerous layering gallstones. There is also a gallstone fixed within the gallbladder neck. There is no wall thickening or focal tenderness to suggest acute cholecystitis. Pericholecystic and peripancreatic edema. Bile ducts: Common bile duct measures 6 mm; there is mild diffuse intrahepatic ductal  dilatation. Non shadowing but still convincing 4 mm defect within the distal common bile duct. Liver: No focal lesion identified. Within normal limits in parenchymal echogenicity. IMPRESSION: 1. Cholelithiasis including stone fixed in the gallbladder neck. 2. Distal CBD stone and biliary dilatation. 3. Pancreatitis. Electronically Signed   By: Marnee SpringJonathon  Watts M.D.   On: 08/14/2015 04:42    EKG: Independently reviewed. Normal sinus rhythm.  Assessment/Plan Principal Problem:   Gallstone pancreatitis   1. Gallstone pancreatitis distal CBD stone and gallstones - ER physician had discussed with on-call gastroenterologist for equal physician who will be seeing patient in consult for possible ERCP. Patient also has gallstones in the gallbladder neck for which patient eventually will need cholecystectomy. At this time I have place patient nothing by mouth and on antibiotics. Continue with pain related medications repeat labs including LFTs.   DVT Prophylaxis SCDs.  Code Status: Full code.  Family Communication: Discussed with patient.  Disposition Plan: Admit to inpatient.    Analucia Hush N. [facility] Pager [phone].  If 7PM-7AM, please contact night-coverage [email or web address] Password [email or web address] 08/14/2015, 5:36 AM

## 2015-08-14 NOTE — ED Notes (Signed)
Dr. Liu at bedside 

## 2015-08-14 NOTE — ED Notes (Signed)
Admitting MD at bedside.

## 2015-08-14 NOTE — Consult Note (Signed)
[facility] CONSULT Reason for consult: Gallstone Pancreatitis Referring Physician: [facility] hospitalist  Lexianna Giarratano is an 24 y.o. female.  HPI: she has no major health problems. She did have some gestational diabetes about 5 years ago. She has had several episodes of pain and presented to the emergency room yesterday with severe pain and was found to have a lipase of 3000 last night that is still elevated this morning. Her LFTs are slightly elevated and have remained about the same. Total bilirubin 2.1 was 2.8 WBC normal ultrasounds obtain showing multiple gallstones a gallbladder probably stuck in the gallbladder neck but no wall thickening with a 6 mm CBD and mild intrahepatic dilatation of the question of a 4 mm defect in the distal CBD patient reports that she still is having significant epigastric pain and nausea it hasn't really changed since her admission last night   Past Medical History  Diagnosis Date  . DIABETES MELLITUS, GESTATIONAL, BORDERLINE 11/26/2007    Qualifier: History of  By: Carlena Sax  MD, Colletta [place]    . PLACENTA ABRUPTIO 06/25/2010    Qualifier: Diagnosis of  By: Verdie Drown MD, Levada Dy      History reviewed. No pertinent past surgical history.  Family History  Problem Relation Age of Onset  . Hypertension Other     Social History:  reports that she has never smoked. She does not have any smokeless tobacco history on file. She reports that she drinks alcohol. She reports that she does not use illicit drugs.  Allergies: No Known Allergies  Medications; Prior to Admission medications   Medication Sig Start Date End Date Taking? Authorizing Provider  ranitidine (ZANTAC) 150 MG capsule Take 1 capsule (150 mg total) by mouth daily. 08/13/15  Yes Malvin Johns, MD   . sodium chloride   Intravenous STAT  . [START ON 08/15/2015] Influenza vac split quadrivalent PF  0.5 mL Intramuscular Tomorrow-1000  . piperacillin-tazobactam (ZOSYN)  IV  3.375 g Intravenous 3 times per day    PRN Meds morphine injection, ondansetron **OR** ondansetron (ZOFRAN) IV Results for orders placed or performed during the hospital encounter of 08/14/15 (from the past 48 hour(s))  I-Stat Beta hCG blood, ED ([facility], [facility], [facility] only)     Status: None   Collection Time: 08/14/15  1:15 AM  Result Value Ref Range   I-stat hCG, quantitative <5.0 <5 mIU/mL   Comment 3            Comment:   GEST. AGE      CONC.  (mIU/mL)   <=1 WEEK        5 - 50     2 WEEKS       50 - 500     3 WEEKS       100 - 10,000     4 WEEKS     1,000 - 30,000        FEMALE AND NON-PREGNANT FEMALE:     LESS THAN 5 mIU/mL   CBC with Differential     Status: Abnormal   Collection Time: 08/14/15  1:17 AM  Result Value Ref Range   WBC 7.1 4.0 - 10.5 K/uL   RBC 4.82 3.87 - 5.11 MIL/uL   Hemoglobin 12.3 12.0 - 15.0 g/dL   HCT 37.7 36.0 - 46.0 %   MCV 78.2 78.0 - 100.0 fL   MCH 25.5 (L) 26.0 - 34.0 pg   MCHC 32.6 30.0 - 36.0 g/dL   RDW 13.7 11.5 - 15.5 %  Platelets 194 150 - 400 K/uL   Neutrophils Relative % 74 %   Neutro Abs 5.3 1.7 - 7.7 K/uL   Lymphocytes Relative 18 %   Lymphs Abs 1.3 0.7 - 4.0 K/uL   Monocytes Relative 7 %   Monocytes Absolute 0.5 0.1 - 1.0 K/uL   Eosinophils Relative 1 %   Eosinophils Absolute 0.0 0.0 - 0.7 K/uL   Basophils Relative 0 %   Basophils Absolute 0.0 0.0 - 0.1 K/uL  Comprehensive metabolic panel     Status: Abnormal   Collection Time: 08/14/15  1:17 AM  Result Value Ref Range   Sodium 139 135 - 145 mmol/L   Potassium 3.6 3.5 - 5.1 mmol/L   Chloride 104 101 - 111 mmol/L   CO2 24 22 - 32 mmol/L   Glucose, Bld 115 (H) 65 - 99 mg/dL   BUN 8 6 - 20 mg/dL   Creatinine, Ser 0.97 0.44 - 1.00 mg/dL   Calcium 9.1 8.9 - 10.3 mg/dL   Total Protein 7.2 6.5 - 8.1 g/dL   Albumin 4.1 3.5 - 5.0 g/dL   AST 330 (H) 15 - 41 U/L   ALT 291 (H) 14 - 54 U/L   Alkaline Phosphatase 61 38 - 126 U/L   Total Bilirubin 2.8 (H) 0.3 - 1.2 mg/dL   GFR calc non Af Amer >60 >60 mL/min   GFR calc Af Amer >60  >60 mL/min    Comment: (NOTE) The eGFR has been calculated using the CKD EPI equation. This calculation has not been validated in all clinical situations. eGFR's persistently <60 mL/min signify possible Chronic Kidney Disease.    Anion gap 11 5 - 15  Lipase, blood     Status: Abnormal   Collection Time: 08/14/15  1:17 AM  Result Value Ref Range   Lipase >3000 (H) 11 - 51 U/L    Comment: RESULTS CONFIRMED BY MANUAL DILUTION  Hepatic function panel     Status: Abnormal   Collection Time: 08/14/15  7:30 AM  Result Value Ref Range   Total Protein 6.1 (L) 6.5 - 8.1 g/dL   Albumin 3.3 (L) 3.5 - 5.0 g/dL   AST 203 (H) 15 - 41 U/L   ALT 247 (H) 14 - 54 U/L   Alkaline Phosphatase 54 38 - 126 U/L   Total Bilirubin 2.1 (H) 0.3 - 1.2 mg/dL   Bilirubin, Direct 0.9 (H) 0.1 - 0.5 mg/dL   Indirect Bilirubin 1.2 (H) 0.3 - 0.9 mg/dL  CBC with Differential/Platelet     Status: Abnormal   Collection Time: 08/14/15  7:30 AM  Result Value Ref Range   WBC 5.1 4.0 - 10.5 K/uL   RBC 4.34 3.87 - 5.11 MIL/uL   Hemoglobin 10.9 (L) 12.0 - 15.0 g/dL   HCT 34.2 (L) 36.0 - 46.0 %   MCV 78.8 78.0 - 100.0 fL   MCH 25.1 (L) 26.0 - 34.0 pg   MCHC 31.9 30.0 - 36.0 g/dL   RDW 13.8 11.5 - 15.5 %   Platelets 170 150 - 400 K/uL   Neutrophils Relative % 60 %   Neutro Abs 3.1 1.7 - 7.7 K/uL   Lymphocytes Relative 32 %   Lymphs Abs 1.6 0.7 - 4.0 K/uL   Monocytes Relative 6 %   Monocytes Absolute 0.3 0.1 - 1.0 K/uL   Eosinophils Relative 1 %   Eosinophils Absolute 0.0 0.0 - 0.7 K/uL   Basophils Relative 1 %   Basophils Absolute 0.0 0.0 -  0.1 K/uL  Lipase, blood     Status: Abnormal   Collection Time: 08/14/15  7:30 AM  Result Value Ref Range   Lipase >3000 (H) 11 - 51 U/L    Comment: RESULTS CONFIRMED BY MANUAL DILUTION  Basic metabolic panel     Status: Abnormal   Collection Time: 08/14/15  7:30 AM  Result Value Ref Range   Sodium 138 135 - 145 mmol/L   Potassium 3.9 3.5 - 5.1 mmol/L   Chloride 109 101 -  111 mmol/L   CO2 24 22 - 32 mmol/L   Glucose, Bld 120 (H) 65 - 99 mg/dL   BUN 8 6 - 20 mg/dL   Creatinine, Ser 0.81 0.44 - 1.00 mg/dL   Calcium 8.3 (L) 8.9 - 10.3 mg/dL   GFR calc non Af Amer >60 >60 mL/min   GFR calc Af Amer >60 >60 mL/min    Comment: (NOTE) The eGFR has been calculated using the CKD EPI equation. This calculation has not been validated in all clinical situations. eGFR's persistently <60 mL/min signify possible Chronic Kidney Disease.    Anion gap 5 5 - 15    US Abdomen Limited Ruq  08/14/2015  CLINICAL DATA:  Epigastric pain for 1 day EXAM: US ABDOMEN LIMITED - RIGHT UPPER QUADRANT COMPARISON:  None. FINDINGS: Gallbladder: Numerous layering gallstones. There is also a gallstone fixed within the gallbladder neck. There is no wall thickening or focal tenderness to suggest acute cholecystitis. Pericholecystic and peripancreatic edema. Bile ducts: Common bile duct measures 6 mm; there is mild diffuse intrahepatic ductal dilatation. Non shadowing but still convincing 4 mm defect within the distal common bile duct. Liver: No focal lesion identified. Within normal limits in parenchymal echogenicity. IMPRESSION: 1. Cholelithiasis including stone fixed in the gallbladder neck. 2. Distal CBD stone and biliary dilatation. 3. Pancreatitis. Electronically Signed   By: Monte Fantasia M.D.   On: 08/14/2015 04:42               Blood pressure 104/43, pulse 55, temperature 98.1 F (36.7 C), temperature source Oral, resp. rate 18, height 5' (1.524 m), weight 53.7 kg (118 lb 6.2 oz), last menstrual period 08/13/2015, SpO2 100 %.  Physical exam:   General-- pleasant young Asian female ENT--nonicteric Neck-- no lymphadenopathy  Heart--regular rate and rhythm without murmurs or gallops Lungs-- clear Abdomen-- soft and nondistended but marked epigastric tenderness minimal bowel sounds  Psych-alert and oriented answers questions appropriately  Aessment: 1. Gallstone  pancreatitis. Patient has gallstones and the gallbladder and slightly dilated ducts in the liver but no increase in bilirubin. She made it past the stone and may have another small stone in the distal CBD. Long discussion with her about biliary disease and gallstone pancreatitis. Her lipase is still markedly elevated  Plan: we discussed the approach of removal of CBD stones prior to cholecystectomy in the fact that we would prefer to wait until her pancreatitis is improved. Her lipase is still markedly elevated 3000. I told her this may take several days of NPO status in order for her pancreas to improve but that once that happened she would need ERCP to clear her CBD. GS questions and these were answered. We will continue to follow her.    Steel Kerney JR,Gyneth Hubka L 08/14/2015, 9:19 AM   Pager: [phone] If no answer or after hours call [phone]

## 2015-08-14 NOTE — ED Provider Notes (Signed)
CSN: [id number]     Arrival date & time 08/14/15  0034 History  By signing my name below, I, [name], attest that this documentation has been prepared under the direction and in the presence of [name], MD. Electronically Signed: Octavia HeirArianna Daugherty, ED Scribe. 08/14/2015. 5:18 AM.    Chief Complaint  Patient presents with  . Chest Pain      The history is provided by the patient. No language interpreter was used.   HPI Comments: [name] is a 24 y.o. female who has a hx of cesarean section presents to the Emergency Department complaining of constant, gradual worsening epigastric abdominal pain that radiates to her chest onset today. She has been having associated nausea, vomiting (9x today) and loose stool (4-5x today). She states she had eggs with [place] this morning upon having her symptoms. Pt was seen here earlier today for the same symptoms and was diagnosed with GERD. She was prescribed Zantac but notes she was unable to keep the medication down due to vomiting. Pt notes she has severe GERD symptoms at least once a year that is alleviated with a GI cocktail but today the GI cocktail did not alleviate her symptoms. She denies fever, cold symptoms, taking ibuprofen or tylenol frequently, urinary frequency, dysuria, and burning with urination.  Past Medical History  Diagnosis Date  . DIABETES MELLITUS, GESTATIONAL, BORDERLINE 11/26/2007    Qualifier: History of  By: [name]  MD, Judeth CornfieldStephanie    . PLACENTA ABRUPTIO 06/25/2010    Qualifier: Diagnosis of  By: [name] MD, [name]     History reviewed. No pertinent past surgical history. No family history on file. Social History  Substance Use Topics  . Smoking status: Never Smoker   . Smokeless tobacco: None  . Alcohol Use: Yes   OB History    No data available     Review of Systems  Constitutional: Negative for fever.  Cardiovascular: Positive for chest pain.  Gastrointestinal: Positive for nausea, vomiting and diarrhea.   Genitourinary: Negative for dysuria and frequency.  All other systems reviewed and are negative.     Allergies  Review of patient's allergies indicates no known allergies.  Home Medications   Prior to Admission medications   Medication Sig Start Date End Date Taking? Authorizing Provider  ranitidine (ZANTAC) 150 MG capsule Take 1 capsule (150 mg total) by mouth daily. 08/13/15  Yes Rolan BuccoMelanie Belfi, MD   Triage vitals: BP 124/74 mmHg  Pulse 74  Temp(Src) 98.5 F (36.9 C)  Resp 16  SpO2 100%  LMP 08/13/2015 Physical Exam Physical Exam  Nursing note and vitals reviewed. Constitutional: Well developed, well nourished, non-toxic, and in no acute distress Head: Normocephalic and atraumatic.  Mouth/Throat: Oropharynx is clear and moist.  Neck: Normal range of motion. Neck supple.  Cardiovascular: Normal rate and regular rhythm.   Pulmonary/Chest: Effort normal and breath sounds normal.  Abdominal: Soft. . There is no rebound and no guarding. abdomen is non-distended, no Murphy's sign, no tenderness at McBurney's point, tenderness at epigastrium. Musculoskeletal: Normal range of motion.  Neurological: Alert, no facial droop, fluent speech Skin: Skin is warm and dry.  Psychiatric: Cooperative  ED Course  Procedures  DIAGNOSTIC STUDIES: Oxygen Saturation is 100% on RA, normal by my interpretation.  COORDINATION OF CARE:  1:06 AM Discussed treatment plan which includes Zofran and lab work with pt at bedside and pt agreed to plan.  Labs Review Labs Reviewed  CBC WITH DIFFERENTIAL/PLATELET - Abnormal; Notable for the  following:    MCH 25.5 (*)    All other components within normal limits  COMPREHENSIVE METABOLIC PANEL - Abnormal; Notable for the following:    Glucose, Bld 115 (*)    AST 330 (*)    ALT 291 (*)    Total Bilirubin 2.8 (*)    All other components within normal limits  LIPASE, BLOOD - Abnormal; Notable for the following:    Lipase >3000 (*)    All other  components within normal limits  I-STAT BETA HCG BLOOD, ED ([facility], [facility], [facility] ONLY)    Imaging Review US Abdomen Limited Ruq  08/14/2015  CLINICAL DATA:  Epigastric pain for 1 day EXAM: US ABDOMEN LIMITED - RIGHT UPPER QUADRANT COMPARISON:  None. FINDINGS: Gallbladder: Numerous layering gallstones. There is also a gallstone fixed within the gallbladder neck. There is no wall thickening or focal tenderness to suggest acute cholecystitis. Pericholecystic and peripancreatic edema. Bile ducts: Common bile duct measures 6 mm; there is mild diffuse intrahepatic ductal dilatation. Non shadowing but still convincing 4 mm defect within the distal common bile duct. Liver: No focal lesion identified. Within normal limits in parenchymal echogenicity. IMPRESSION: 1. Cholelithiasis including stone fixed in the gallbladder neck. 2. Distal CBD stone and biliary dilatation. 3. Pancreatitis. Electronically Signed   By: Marnee Spring M.D.   On: 08/14/2015 04:42   I have personally reviewed and evaluated these images and lab results as part of my medical decision-making.   EKG Interpretation None      MDM   Final diagnoses:  Pancreatitis  Calculus of bile duct with obstruction and without cholangitis or cholecystitis   In short, this a 24 year old female with no prior abdominal surgeries who presents with epigastric abdominal pain with nausea and vomiting for 1 day. She is nontoxic and in no acute distress. Her vital signs are non-concerning. She has a soft and nonsurgical abdomen, with primarily epigastric tenderness to palpation. Blood work is concerning for pancreatitis with lipase greater than 3000. Also transaminitis with hyperbilirubinemia concerning for bile duct stone. Right upper quadrant ultrasound is performed, revealing choledocholithiasis. No evidence of cholecystitis on imaging or blood work. Pain and symptoms well controlled with IV fluids, antiemetics, analgesics. Discussed with [facility] gastroenterology  with plans for potential ERCP. Admitted to the hospitalist service for ongoing management.   I personally performed the services described in this documentation, which was scribed in my presence. The recorded information has been reviewed and is accurate.   Lavera Guise, MD 08/14/15 [phone]

## 2015-08-15 LAB — BASIC METABOLIC PANEL
ANION GAP: 7 (ref 5–15)
BUN: 8 mg/dL (ref 6–20)
CALCIUM: 8.1 mg/dL — AB (ref 8.9–10.3)
CO2: 21 mmol/L — AB (ref 22–32)
CREATININE: 0.87 mg/dL (ref 0.44–1.00)
Chloride: 111 mmol/L (ref 101–111)
GFR calc Af Amer: >60 mL/min (ref >60)
GFR calc non Af Amer: >60 mL/min (ref >60)
GLUCOSE: 75 mg/dL (ref 65–99)
Potassium: 3.7 mmol/L (ref 3.5–5.1)
Sodium: 139 mmol/L (ref 135–145)

## 2015-08-15 LAB — GLUCOSE, CAPILLARY
GLUCOSE-CAPILLARY: 165 mg/dL — AB (ref 65–99)
GLUCOSE-CAPILLARY: 71 mg/dL (ref 65–99)
GLUCOSE-CAPILLARY: 153 mg/dL — AB (ref 65–99)
Glucose-Capillary: 113 mg/dL — ABNORMAL HIGH (ref 65–99)
Glucose-Capillary: 74 mg/dL (ref 65–99)
Glucose-Capillary: 59 mg/dL — ABNORMAL LOW (ref 65–99)

## 2015-08-15 LAB — HEPATIC FUNCTION PANEL
ALT: 158 U/L — ABNORMAL HIGH (ref 14–54)
AST: 59 U/L — AB (ref 15–41)
Albumin: 2.8 g/dL — ABNORMAL LOW (ref 3.5–5.0)
Alkaline Phosphatase: 46 U/L (ref 38–126)
BILIRUBIN DIRECT: 0.2 mg/dL (ref 0.1–0.5)
BILIRUBIN TOTAL: 1.1 mg/dL (ref 0.3–1.2)
Indirect Bilirubin: 0.9 mg/dL (ref 0.3–0.9)
Total Protein: 5.5 g/dL — ABNORMAL LOW (ref 6.5–8.1)

## 2015-08-15 LAB — CBC
HCT: 31.2 % — ABNORMAL LOW (ref 36.0–46.0)
Hemoglobin: 10.1 g/dL — ABNORMAL LOW (ref 12.0–15.0)
MCH: 25.7 pg — AB (ref 26.0–34.0)
MCHC: 32.4 g/dL (ref 30.0–36.0)
MCV: 79.4 fL (ref 78.0–100.0)
PLATELETS: 145 10*3/uL — AB (ref 150–400)
RBC: 3.93 MIL/uL (ref 3.87–5.11)
RDW: 14.1 % (ref 11.5–15.5)
WBC: 4.3 10*3/uL (ref 4.0–10.5)

## 2015-08-15 LAB — LIPASE, BLOOD: Lipase: 203 U/L — ABNORMAL HIGH (ref 11–51)

## 2015-08-15 MED ORDER — DEXTROSE 50 % IV SOLN
INTRAVENOUS | Status: AC
  Administered 2015-08-15: 25 mL
  Filled 2015-08-15: qty 50

## 2015-08-15 MED ORDER — DEXTROSE-NACL 5-0.45 % IV SOLN
INTRAVENOUS | Status: DC
  Administered 2015-08-15 – 2015-08-19 (×7): via INTRAVENOUS

## 2015-08-15 MED ORDER — DEXTROSE 50 % IV SOLN
25.0000 mL | Freq: Once | INTRAVENOUS | Status: AC
  Administered 2015-08-15: 25 mL via INTRAVENOUS

## 2015-08-15 MED ORDER — RISAQUAD PO CAPS
2.0000 | ORAL_CAPSULE | Freq: Every day | ORAL | Status: DC
  Administered 2015-08-15 – 2015-08-19 (×4): 2 via ORAL
  Filled 2015-08-15 (×5): qty 2

## 2015-08-15 MED ORDER — ENOXAPARIN SODIUM 40 MG/0.4ML ~~LOC~~ SOLN
40.0000 mg | SUBCUTANEOUS | Status: DC
  Administered 2015-08-16 – 2015-08-18 (×2): 40 mg via SUBCUTANEOUS
  Filled 2015-08-15 (×2): qty 0.4

## 2015-08-15 MED ORDER — SODIUM CHLORIDE 0.9 % IV SOLN: INTRAVENOUS | Status: DC

## 2015-08-15 NOTE — Progress Notes (Signed)
[facility] PROGRESS NOTE Subjective patient still feel some discomfort but it is better  Objective: Vital signs in last 24 hours: Temp:  [98.4 F (36.9 C)-98.6 F (37 C)] 98.5 F (36.9 C) (12/24 0932) Pulse Rate:  [59-75] 75 (12/24 0932) Resp:  [17-18] 17 (12/24 0932) BP: (90-102)/(46-60) 90/52 mmHg (12/24 0932) SpO2:  [96 %-100 %] 100 % (12/24 0932) Weight:  [53.524 kg (118 lb)] 53.524 kg (118 lb) (12/23 2019) Last BM Date: 08/14/15  Intake/Output from previous day: 12/23 0701 - 12/24 0700 In: 1100 [I.V.:1000; IV Piggyback:100] Out: -  Intake/Output this shift:    PE: General-- no acute distress  Abdomen-- still mild epigastric tenderness  Lab Results:  Recent Labs  08/14/15 0117 08/14/15 0730 08/15/15 0450  WBC 7.1 5.1 4.3  HGB 12.3 10.9* 10.1*  HCT 37.7 34.2* 31.2*  PLT 194 170 145*   BMET  Recent Labs  08/14/15 0117 08/14/15 0730 08/15/15 0450  NA 139 138 139  K 3.6 3.9 3.7  CL 104 109 111  CO2 24 24 21*  CREATININE 0.97 0.81 0.87   LFT  Recent Labs  08/14/15 0117 08/14/15 0730 08/15/15 0450  PROT 7.2 6.1* 5.5*  AST 330* 203* 59*  ALT 291* 247* 158*  ALKPHOS 61 54 46  BILITOT 2.8* 2.1* 1.1  BILIDIR  --  0.9* 0.2  IBILI  --  1.2* 0.9   PT/INR No results for input(s): LABPROT, INR in the last 72 hours. PANCREAS  Recent Labs  08/14/15 0117 08/14/15 0730 08/15/15 0450  LIPASE >3000* >3000* 203*         Studies/Results: [place] Abdomen Limited Ruq  08/14/2015  CLINICAL DATA:  Epigastric pain for 1 day EXAM: US ABDOMEN LIMITED - RIGHT UPPER QUADRANT COMPARISON:  None. FINDINGS: Gallbladder: Numerous layering gallstones. There is also a gallstone fixed within the gallbladder neck. There is no wall thickening or focal tenderness to suggest acute cholecystitis. Pericholecystic and peripancreatic edema. Bile ducts: Common bile duct measures 6 mm; there is mild diffuse intrahepatic ductal dilatation. Non shadowing but still  convincing 4 mm defect within the distal common bile duct. Liver: No focal lesion identified. Within normal limits in parenchymal echogenicity. IMPRESSION: 1. Cholelithiasis including stone fixed in the gallbladder neck. 2. Distal CBD stone and biliary dilatation. 3. Pancreatitis. Electronically Signed   By: Marnee SpringJonathon  Watts M.D.   On: 08/14/2015 04:42    Medications: I have reviewed the patient's current medications.  Assessment/Plan: 1. Gallstone pancreatitis. Lipase is still elevated but improving. LFT's not particularly elevated. Does not appear to have any signs of cholangitis. Have explained to her again we need to let her pancreas improve, perform ERCP to remove what appears to be a stone in the CBD and then remove the gallbladder. Due to the holiday season only emergencies are being performed. We therefore will go ahead and place her on the schedule for Tuesday for ERCP and sphincterotomy with stone removal.   Jezlyn Westerfield JR,Cristine Daw L 08/15/2015, 12:09 PM  Pager: [phone] If no answer or after hours call [phone]

## 2015-08-15 NOTE — Progress Notes (Addendum)
Patient Demographics  Christine SchimkeHien Daugherty, is a 24 y.o. female, DOB - Feb 08, 1991, ZOX:[id number]  Admit date - 08/14/2015   Admitting Physician Eduard ClosArshad N Kakrakandy, MD  Outpatient Primary MD for the patient is Tawni CarnesAndrew Wight, MD  LOS - 1   Chief Complaint  Patient presents with  . Chest Pain       Admission history of present illness/brief narrative: 24 year old female without significant past medical history, presents with complaints of abdominal pain, in ED workup was significant for elevated lipase more than 3000, elevated LFTs, right upper quadrant ultrasound significant for gallbladder stone and distal CBD stone, patient is nothing by mouth, on IV fluid, followed by GI .  Subjective:   [name] today has, No headache, No chest pain, No Cough - SOB,no vomiting, abdominal pain significantly subsided   Assessment & Plan    Principal Problem:   Gallstone pancreatitis  Gallstone pancreatitis - Gastroenterology consult greatly appreciated, continue with  IV fluid hydration, will add D5 half-normal saline at 50 mL/h, given mild hypoglycemia this a.m. as patient is nothing by mouth , keep nothing by mouth, continue with when necessary pain and nausea medication, LFTs trending down,  lipase trending down as well , discussed plan with gastroenterology, will continue with Zosyn ,will need ERCP when her pancreatitis resolve.  And she will need cholecystectomy when her pancreatitis resolved .  Code Status: Full  Family Communication: discussed with patient, multiple family members at bedside    Disposition Plan:home when stable   Procedures  none   Consults   GI   Medications  Scheduled Meds: . acidophilus  2 capsule Oral Daily  . enoxaparin (LOVENOX) injection  40 mg Subcutaneous Q24H  . Influenza vac split quadrivalent PF  0.5 mL Intramuscular Tomorrow-1000  . piperacillin-tazobactam (ZOSYN)  IV   3.375 g Intravenous 3 times per day   Continuous Infusions: . sodium chloride 50 mL/hr at 08/15/15 0830  . dextrose 5 % and 0.45% NaCl 50 mL/hr at 08/15/15 0830   PRN Meds:.morphine injection, ondansetron **OR** ondansetron (ZOFRAN) IV  DVT Prophylaxis  Lovenox   Lab Results  Component Value Date   PLT 145* 08/15/2015    Antibiotics    Anti-infectives    Start     Dose/Rate Route Frequency Ordered Stop   08/14/15 0645  piperacillin-tazobactam (ZOSYN) IVPB 3.375 g     3.375 g 12.5 mL/hr over 240 Minutes Intravenous 3 times per day 08/14/15 0619            Objective:   Filed Vitals:   08/14/15 1744 08/14/15 2019 08/15/15 0506 08/15/15 0932  BP: 102/60 98/48 92/46  90/52  Pulse: 71 59 72 75  Temp: 98.4 F (36.9 C) 98.6 F (37 C) 98.4 F (36.9 C) 98.5 F (36.9 C)  TempSrc: Oral   Oral  Resp: 18 18 17 17   Height:      Weight:  53.524 kg (118 lb)    SpO2: 100% 96% 100% 100%    Wt Readings from Last 3 Encounters:  08/14/15 53.524 kg (118 lb)  01/22/15 54.386 kg (119 lb 14.4 oz)  11/16/13 54.432 kg (120 lb)     Intake/Output Summary (Last 24 hours) at 08/15/15 1610 Last data filed at 08/15/15 1329  Gross per 24 hour  Intake   1100 ml  Output      0 ml  Net   1100 ml     Physical Exam  Awake Alert, Oriented X 3, No new F.N deficits, Normal affect [place].AT,PERRAL Supple Neck,No JVD, No cervical lymphadenopathy appriciated.  Symmetrical Chest wall movement, Good air movement bilaterally, CTAB RRR,No Gallops,Rubs or new Murmurs, No Parasternal Heave +ve B.Sounds, Abd Soft, mild  epigastric  tenderness, No organomegaly appriciated, No rebound - guarding or rigidity. No Cyanosis, Clubbing or edema, No new Rash or bruise     Data Review   Micro Results No results found for this or any previous visit (from the past 240 hour(s)).  Radiology Reports US Abdomen Limited Ruq  08/14/2015  CLINICAL DATA:  Epigastric pain for 1 day EXAM: US ABDOMEN LIMITED -  RIGHT UPPER QUADRANT COMPARISON:  None. FINDINGS: Gallbladder: Numerous layering gallstones. There is also a gallstone fixed within the gallbladder neck. There is no wall thickening or focal tenderness to suggest acute cholecystitis. Pericholecystic and peripancreatic edema. Bile ducts: Common bile duct measures 6 mm; there is mild diffuse intrahepatic ductal dilatation. Non shadowing but still convincing 4 mm defect within the distal common bile duct. Liver: No focal lesion identified. Within normal limits in parenchymal echogenicity. IMPRESSION: 1. Cholelithiasis including stone fixed in the gallbladder neck. 2. Distal CBD stone and biliary dilatation. 3. Pancreatitis. Electronically Signed   By: Marnee Spring M.D.   On: 08/14/2015 04:42     CBC  Recent Labs Lab 08/14/15 0117 08/14/15 0730 08/15/15 0450  WBC 7.1 5.1 4.3  HGB 12.3 10.9* 10.1*  HCT 37.7 34.2* 31.2*  PLT 194 170 145*  MCV 78.2 78.8 79.4  MCH 25.5* 25.1* 25.7*  MCHC 32.6 31.9 32.4  RDW 13.7 13.8 14.1  LYMPHSABS 1.3 1.6  --   MONOABS 0.5 0.3  --   EOSABS 0.0 0.0  --   BASOSABS 0.0 0.0  --     Chemistries   Recent Labs Lab 08/14/15 0117 08/14/15 0730 08/15/15 0450  NA 139 138 139  K 3.6 3.9 3.7  CL 104 109 111  CO2 24 24 21*  GLUCOSE 115* 120* 75  BUN CREATININE 0.97 0.81 0.87  CALCIUM 9.1 8.3* 8.1*  AST 330* 203* 59*  ALT 291* 247* 158*  ALKPHOS 61 54 46  BILITOT 2.8* 2.1* 1.1   ------------------------------------------------------------------------------------------------------------------ estimated creatinine clearance is 70.4 mL/min (by C-G formula based on Cr of 0.87). ------------------------------------------------------------------------------------------------------------------ No results for input(s): HGBA1C in the last 72 hours. ------------------------------------------------------------------------------------------------------------------ No results for input(s): CHOL, HDL,  LDLCALC, TRIG, CHOLHDL, LDLDIRECT in the last 72 hours. ------------------------------------------------------------------------------------------------------------------ No results for input(s): TSH, T4TOTAL, T3FREE, THYROIDAB in the last 72 hours.  Invalid input(s): FREET3 ------------------------------------------------------------------------------------------------------------------ No results for input(s): VITAMINB12, FOLATE, FERRITIN, TIBC, IRON, RETICCTPCT in the last 72 hours.  Coagulation profile No results for input(s): INR, PROTIME in the last 168 hours.  No results for input(s): DDIMER in the last 72 hours.  Cardiac Enzymes No results for input(s): CKMB, TROPONINI, MYOGLOBIN in the last 168 hours.  Invalid input(s): CK ------------------------------------------------------------------------------------------------------------------ Invalid input(s): POCBNP     Time Spent in minutes   25 minutes   ELGERGAWY, DAWOOD M.D on 08/15/2015 at 4:10 PM  Between 7am to 7pm - Pager - [phone]  After 7pm go to [email or web address] - password [facility]  [facility]   Office  [phone]

## 2015-08-15 NOTE — Progress Notes (Signed)
Hypoglycemic Event  CBG: 54  Treatment: D50 IV 25 mL  Symptoms: None  Follow-up CBG: Time: 1735 CBG Result: 153  Possible Reasons for Event: Inadequate meal intake  Comments/MD notified:Dr. Elgergawy notified    Royce MacadamiaAdams, Sharilynn Cassity N

## 2015-08-15 NOTE — Progress Notes (Signed)
At 2017, the patient's blood sugar was 66.  She was c/o tiredness only.  1/2 amp D50 was given.  At 2046, CBG was 165.  At 2354, her CBG was 7868 and she was asymptomatic.  1/2 amp D50 was given.  At 0105, CBG was 113.  At 0505, her CBG was 74.  At 0725, her CBG was 71.  Will continue to monitor patient.  Oncoming RN to make oncoming provider aware.  [employer] [employer] MinorKimberly Jacalyn Biggs RN-BC, WTA.

## 2015-08-16 LAB — GLUCOSE, CAPILLARY
GLUCOSE-CAPILLARY: 77 mg/dL (ref 65–99)
Glucose-Capillary: 81 mg/dL (ref 65–99)
Glucose-Capillary: 97 mg/dL (ref 65–99)
Glucose-Capillary: 106 mg/dL — ABNORMAL HIGH (ref 65–99)

## 2015-08-16 LAB — COMPREHENSIVE METABOLIC PANEL
ALK PHOS: 49 U/L (ref 38–126)
ALT: 111 U/L — AB (ref 14–54)
AST: 28 U/L (ref 15–41)
Albumin: 2.9 g/dL — ABNORMAL LOW (ref 3.5–5.0)
Anion gap: 5 (ref 5–15)
CALCIUM: 8.2 mg/dL — AB (ref 8.9–10.3)
CO2: 25 mmol/L (ref 22–32)
CREATININE: 0.91 mg/dL (ref 0.44–1.00)
Chloride: 109 mmol/L (ref 101–111)
GFR calc non Af Amer: >60 mL/min (ref >60)
GLUCOSE: 110 mg/dL — AB (ref 65–99)
Potassium: 3.4 mmol/L — ABNORMAL LOW (ref 3.5–5.1)
SODIUM: 139 mmol/L (ref 135–145)
TOTAL PROTEIN: 5.8 g/dL — AB (ref 6.5–8.1)
Total Bilirubin: 0.9 mg/dL (ref 0.3–1.2)

## 2015-08-16 LAB — LIPASE, BLOOD: LIPASE: 81 U/L — AB (ref 11–51)

## 2015-08-16 MED ORDER — POTASSIUM CHLORIDE CRYS ER 20 MEQ PO TBCR
20.0000 meq | EXTENDED_RELEASE_TABLET | Freq: Once | ORAL | Status: AC
  Administered 2015-08-16: 20 meq via ORAL
  Filled 2015-08-16: qty 1

## 2015-08-16 MED ORDER — POTASSIUM CHLORIDE CRYS ER 20 MEQ PO TBCR
40.0000 meq | EXTENDED_RELEASE_TABLET | Freq: Once | ORAL | Status: AC
  Administered 2015-08-16: 40 meq via ORAL
  Filled 2015-08-16: qty 2

## 2015-08-16 NOTE — Progress Notes (Signed)
[facility] PROGRESS NOTE Subjective Feels better still some discomfort.  Objective: Vital signs in last 24 hours: Temp:  [98.4 F (36.9 C)-98.6 F (37 C)] 98.4 F (36.9 C) (12/25 0500) Pulse Rate:  [60-71] 64 (12/25 0500) Resp:  [16-18] 16 (12/25 0500) BP: (97-114)/(53-72) 97/55 mmHg (12/25 0500) SpO2:  [100 %] 100 % (12/25 0500) Weight:  [54.568 kg (120 lb 4.8 oz)] 54.568 kg (120 lb 4.8 oz) (12/24 2059) Last BM Date: 08/14/15  Intake/Output from previous day:   Intake/Output this shift:    PE: General-- Heart-- Lungs-- Abdomen--slight abd discomfort.  Lab Results:  Recent Labs  08/14/15 0117 08/14/15 0730 08/15/15 0450  WBC 7.1 5.1 4.3  HGB 12.3 10.9* 10.1*  HCT 37.7 34.2* 31.2*  PLT 194 170 145*   BMET  Recent Labs  08/14/15 0117 08/14/15 0730 08/15/15 0450 08/16/15 0535  NA 139 138 139 139  K 3.6 3.9 3.7 3.4*  CL 104 109 111 109  CO2 24 24 21* 25  CREATININE 0.97 0.81 0.87 0.91   LFT  Recent Labs  08/14/15 0730 08/15/15 0450 08/16/15 0535  PROT 6.1* 5.5* 5.8*  AST 203* 59* 28  ALT 247* 158* 111*  ALKPHOS 54 46 49  BILITOT 2.1* 1.1 0.9  BILIDIR 0.9* 0.2  --   IBILI 1.2* 0.9  --    PT/INR No results for input(s): LABPROT, INR in the last 72 hours. PANCREAS  Recent Labs  08/14/15 0730 08/15/15 0450 08/16/15 0535  LIPASE >3000* 203* 81*         Studies/Results: No results found.  Medications: I have reviewed the patient's current medications.  Assessment/Plan: 1. GS Pancreatitis. Still with elevated lipase, but better set up for ERCP  Tues AM will give FF clears.   Kolleen Ochsner JR,Emre Stock L 08/16/2015, 9:58 AM  Pager: [phone] If no answer or after hours call [phone]

## 2015-08-16 NOTE — Progress Notes (Signed)
Patient Demographics  Tyrone SchimkeHien Janus, is a 24 y.o. female, DOB - 04-05-1991, ZOX:[id number]  Admit date - 08/14/2015   Admitting Physician Eduard ClosArshad N Kakrakandy, MD  Outpatient Primary MD for the patient is Tawni CarnesAndrew Wight, MD  LOS - 2   Chief Complaint  Patient presents with  . Chest Pain       Admission history of present illness/brief narrative: 24 year old female without significant past medical history, presents with complaints of abdominal pain, in ED workup was significant for elevated lipase more than 3000, elevated LFTs, right upper quadrant ultrasound significant for gallbladder stone and distal CBD stone, patient is nothing by mouth, on IV fluid, followed by GI .  Subjective:   Lariza Caraway today has, No headache, No chest pain, No Cough - SOB,no vomiting, abdominal pain significantly subsided   Assessment & Plan    Principal Problem:   Gallstone pancreatitis  Gallstone pancreatitis - Gastroenterology consult greatly appreciated, continue with  IV fluid hydration, continue with when necessary pain and nausea medication, LFTs trending down,  lipase trending down as well , discussed plan with gastroenterology, will continue with Zosyn ,will need ERCP when her pancreatitis resolve, most likely on tuesday, .  As well consulted surgery to evaluate for cholecystectomy when her pancreatitis resolved .  Code Status: Full  Family Communication: discussed with patient, multiple family members at bedside    Disposition Plan:home when stable   Procedures  none   Consults   GI   Medications  Scheduled Meds: . acidophilus  2 capsule Oral Daily  . enoxaparin (LOVENOX) injection  40 mg Subcutaneous Q24H  . Influenza vac split quadrivalent PF  0.5 mL Intramuscular Tomorrow-1000  . piperacillin-tazobactam (ZOSYN)  IV  3.375 g Intravenous 3 times per day   Continuous Infusions: . sodium chloride  50 mL/hr at 08/15/15 0830  . dextrose 5 % and 0.45% NaCl 75 mL/hr at 08/16/15 1243   PRN Meds:.morphine injection, ondansetron **OR** ondansetron (ZOFRAN) IV  DVT Prophylaxis  Lovenox   Lab Results  Component Value Date   PLT 145* 08/15/2015    Antibiotics    Anti-infectives    Start     Dose/Rate Route Frequency Ordered Stop   08/14/15 0645  piperacillin-tazobactam (ZOSYN) IVPB 3.375 g     3.375 g 12.5 mL/hr over 240 Minutes Intravenous 3 times per day 08/14/15 0619            Objective:   Filed Vitals:   08/15/15 1822 08/15/15 2059 08/16/15 0500 08/16/15 1201  BP: 114/72 104/53 97/55 98/53   Pulse: 60 71 64 66  Temp: 98.4 F (36.9 C) 98.6 F (37 C) 98.4 F (36.9 C) 98.1 F (36.7 C)  TempSrc: Oral Oral Oral   Resp: 18 16 16 18   Height:      Weight:  54.568 kg (120 lb 4.8 oz)    SpO2: 100% 100% 100% 100%    Wt Readings from Last 3 Encounters:  08/15/15 54.568 kg (120 lb 4.8 oz)  01/22/15 54.386 kg (119 lb 14.4 oz)  11/16/13 54.432 kg (120 lb)     Intake/Output Summary (Last 24 hours) at 08/16/15 1638 Last data filed at 08/16/15 1500  Gross per 24 hour  Intake      0  ml  Output      0 ml  Net      0 ml     Physical Exam  Awake Alert, Oriented X 3, No new F.N deficits, Normal affect [place].AT,PERRAL Supple Neck,No JVD, No cervical lymphadenopathy appriciated.  Symmetrical Chest wall movement, Good air movement bilaterally, CTAB RRR,No Gallops,Rubs or new Murmurs, No Parasternal Heave +ve B.Sounds, Abd Soft, no  tenderness, No organomegaly appriciated, No rebound - guarding or rigidity. No Cyanosis, Clubbing or edema, No new Rash or bruise     Data Review   Micro Results No results found for this or any previous visit (from the past 240 hour(s)).  Radiology Reports US Abdomen Limited Ruq  08/14/2015  CLINICAL DATA:  Epigastric pain for 1 day EXAM: US ABDOMEN LIMITED - RIGHT UPPER QUADRANT COMPARISON:  None. FINDINGS: Gallbladder: Numerous layering  gallstones. There is also a gallstone fixed within the gallbladder neck. There is no wall thickening or focal tenderness to suggest acute cholecystitis. Pericholecystic and peripancreatic edema. Bile ducts: Common bile duct measures 6 mm; there is mild diffuse intrahepatic ductal dilatation. Non shadowing but still convincing 4 mm defect within the distal common bile duct. Liver: No focal lesion identified. Within normal limits in parenchymal echogenicity. IMPRESSION: 1. Cholelithiasis including stone fixed in the gallbladder neck. 2. Distal CBD stone and biliary dilatation. 3. Pancreatitis. Electronically Signed   By: Marnee Spring M.D.   On: 08/14/2015 04:42     CBC  Recent Labs Lab 08/14/15 0117 08/14/15 0730 08/15/15 0450  WBC 7.1 5.1 4.3  HGB 12.3 10.9* 10.1*  HCT 37.7 34.2* 31.2*  PLT 194 170 145*  MCV 78.2 78.8 79.4  MCH 25.5* 25.1* 25.7*  MCHC 32.6 31.9 32.4  RDW 13.7 13.8 14.1  LYMPHSABS 1.3 1.6  --   MONOABS 0.5 0.3  --   EOSABS 0.0 0.0  --   BASOSABS 0.0 0.0  --     Chemistries   Recent Labs Lab 08/14/15 0117 08/14/15 0730 08/15/15 0450 08/16/15 0535  NA 139 138 139 139  K 3.6 3.9 3.7 3.4*  CL 104 109 111 109  CO2 24 24 21* 25  GLUCOSE 115* 120* 75 110*  BUN <5*  CREATININE 0.97 0.81 0.87 0.91  CALCIUM 9.1 8.3* 8.1* 8.2*  AST 330* 203* 59* 28  ALT 291* 247* 158* 111*  ALKPHOS 61 54 46 49  BILITOT 2.8* 2.1* 1.1 0.9   ------------------------------------------------------------------------------------------------------------------ estimated creatinine clearance is 72.6 mL/min (by C-G formula based on Cr of 0.91). ------------------------------------------------------------------------------------------------------------------ No results for input(s): HGBA1C in the last 72 hours. ------------------------------------------------------------------------------------------------------------------ No results for input(s): CHOL, HDL, LDLCALC, TRIG, CHOLHDL,  LDLDIRECT in the last 72 hours. ------------------------------------------------------------------------------------------------------------------ No results for input(s): TSH, T4TOTAL, T3FREE, THYROIDAB in the last 72 hours.  Invalid input(s): FREET3 ------------------------------------------------------------------------------------------------------------------ No results for input(s): VITAMINB12, FOLATE, FERRITIN, TIBC, IRON, RETICCTPCT in the last 72 hours.  Coagulation profile No results for input(s): INR, PROTIME in the last 168 hours.  No results for input(s): DDIMER in the last 72 hours.  Cardiac Enzymes No results for input(s): CKMB, TROPONINI, MYOGLOBIN in the last 168 hours.  Invalid input(s): CK ------------------------------------------------------------------------------------------------------------------ Invalid input(s): POCBNP     Time Spent in minutes   25 minutes   Zoila Ditullio M.D on 08/16/2015 at 4:38 PM  Between 7am to 7pm - Pager - [phone]  After 7pm go to [email or web address] - password [facility]  [facility]   Office  [phone]

## 2015-08-17 ENCOUNTER — Encounter (HOSPITAL_COMMUNITY): Payer: Self-pay | Admitting: Certified Registered Nurse Anesthetist

## 2015-08-17 LAB — COMPREHENSIVE METABOLIC PANEL
ALBUMIN: 3.2 g/dL — AB (ref 3.5–5.0)
ALT: 88 U/L — AB (ref 14–54)
AST: 24 U/L (ref 15–41)
Alkaline Phosphatase: 47 U/L (ref 38–126)
Anion gap: 6 (ref 5–15)
CALCIUM: 8.6 mg/dL — AB (ref 8.9–10.3)
CO2: 24 mmol/L (ref 22–32)
CREATININE: 0.92 mg/dL (ref 0.44–1.00)
Chloride: 110 mmol/L (ref 101–111)
GFR calc non Af Amer: >60 mL/min (ref >60)
Glucose, Bld: 108 mg/dL — ABNORMAL HIGH (ref 65–99)
Potassium: 3.7 mmol/L (ref 3.5–5.1)
SODIUM: 140 mmol/L (ref 135–145)
TOTAL PROTEIN: 5.9 g/dL — AB (ref 6.5–8.1)
Total Bilirubin: 0.7 mg/dL (ref 0.3–1.2)

## 2015-08-17 LAB — GLUCOSE, CAPILLARY
GLUCOSE-CAPILLARY: 99 mg/dL (ref 65–99)
Glucose-Capillary: 103 mg/dL — ABNORMAL HIGH (ref 65–99)
Glucose-Capillary: 86 mg/dL (ref 65–99)
Glucose-Capillary: 83 mg/dL (ref 65–99)

## 2015-08-17 LAB — LIPASE, BLOOD: LIPASE: 94 U/L — AB (ref 11–51)

## 2015-08-17 MED ORDER — SODIUM CHLORIDE 0.9 % IV SOLN: INTRAVENOUS | Status: DC

## 2015-08-17 NOTE — Progress Notes (Signed)
Patient Demographics  [name], is a 24 y.o. female, DOB - May 31, 1991, ZOX:[id number]  Admit date - 08/14/2015   Admitting Physician Eduard ClosArshad N Kakrakandy, MD  Outpatient Primary MD for the patient is Tawni CarnesAndrew Wight, MD  LOS - 3   Chief Complaint  Patient presents with  . Chest Pain       Admission history of present illness/brief narrative: 24 year old female without significant past medical history, presents with complaints of abdominal pain, in ED workup was significant for elevated lipase more than 3000, elevated LFTs, right upper quadrant ultrasound significant for gallbladder stone and distal CBD stone, initially patient is nothing by mouth, on IV fluid, followed by GI , lipase significantly trending down, advanced to liquid diet, plan for ERCP 12/27, as well as surgery consulted, plan for laparoscopic cholecystectomy post ERCP.  Subjective:   Baby Maxon today has, No headache, No chest pain, No Cough - SOB,no vomiting, denies any abdominal pain, tolerating clear liquid diet  Assessment & Plan    Principal Problem:   Gallstone pancreatitis  Gallstone pancreatitis - Gastroenterology consult greatly appreciated, continue with  IV fluid hydration, continue with when necessary pain and nausea medication, LFTs trending down,  lipase ignition currently trending down as well , discussed plan with gastroenterology, will continue with Zosyn , patient will go for ERCP tomorrow. Surgery consulted, they planned laparoscopic cholecystectomy tomorrow after ERCP   Code Status: Full  Family Communication: Scars with patient  Disposition Plan:home when stable   Procedures  none   Consults   GI Gen. surgery  Medications  Scheduled Meds: . acidophilus  2 capsule Oral Daily  . enoxaparin (LOVENOX) injection  40 mg Subcutaneous Q24H  . Influenza vac split quadrivalent PF  0.5 mL Intramuscular  Tomorrow-1000  . piperacillin-tazobactam (ZOSYN)  IV  3.375 g Intravenous 3 times per day   Continuous Infusions: . sodium chloride 50 mL/hr at 08/15/15 0830  . sodium chloride    . dextrose 5 % and 0.45% NaCl 75 mL/hr at 08/17/15 0200   PRN Meds:.morphine injection, ondansetron **OR** ondansetron (ZOFRAN) IV  DVT Prophylaxis  Lovenox   Lab Results  Component Value Date   PLT 145* 08/15/2015    Antibiotics    Anti-infectives    Start     Dose/Rate Route Frequency Ordered Stop   08/14/15 0645  piperacillin-tazobactam (ZOSYN) IVPB 3.375 g     3.375 g 12.5 mL/hr over 240 Minutes Intravenous 3 times per day 08/14/15 0619            Objective:   Filed Vitals:   08/16/15 1732 08/16/15 2042 08/17/15 0611 08/17/15 0800  BP: 101/58 103/41 94/51 101/55  Pulse: 68 70 58 72  Temp: 98 F (36.7 C) 98.1 F (36.7 C) 98.4 F (36.9 C) 98.2 F (36.8 C)  TempSrc: Oral Oral Oral Oral  Resp: 18 19 20 18   Height:      Weight:  54.8 kg (120 lb 13 oz)    SpO2: 100% 100% 100% 100%    Wt Readings from Last 3 Encounters:  08/16/15 54.8 kg (120 lb 13 oz)  01/22/15 54.386 kg (119 lb 14.4 oz)  11/16/13 54.432 kg (120 lb)     Intake/Output Summary (Last 24 hours) at 08/17/15  1415 Last data filed at 08/17/15 1230  Gross per 24 hour  Intake    240 ml  Output      0 ml  Net    240 ml     Physical Exam  Awake Alert, Oriented X 3, No new F.N deficits, Normal affect [place].AT,PERRAL Supple Neck,No JVD, No cervical lymphadenopathy appriciated.  Symmetrical Chest wall movement, Good air movement bilaterally, CTAB RRR,No Gallops,Rubs or new Murmurs, No Parasternal Heave +ve B.Sounds, Abd Soft, no  tenderness, No organomegaly appriciated, No rebound - guarding or rigidity. No Cyanosis, Clubbing or edema, No new Rash or bruise     Data Review   Micro Results No results found for this or any previous visit (from the past 240 hour(s)).  Radiology Reports US Abdomen Limited  Ruq  08/14/2015  CLINICAL DATA:  Epigastric pain for 1 day EXAM: US ABDOMEN LIMITED - RIGHT UPPER QUADRANT COMPARISON:  None. FINDINGS: Gallbladder: Numerous layering gallstones. There is also a gallstone fixed within the gallbladder neck. There is no wall thickening or focal tenderness to suggest acute cholecystitis. Pericholecystic and peripancreatic edema. Bile ducts: Common bile duct measures 6 mm; there is mild diffuse intrahepatic ductal dilatation. Non shadowing but still convincing 4 mm defect within the distal common bile duct. Liver: No focal lesion identified. Within normal limits in parenchymal echogenicity. IMPRESSION: 1. Cholelithiasis including stone fixed in the gallbladder neck. 2. Distal CBD stone and biliary dilatation. 3. Pancreatitis. Electronically Signed   By: Marnee Spring M.D.   On: 08/14/2015 04:42     CBC  Recent Labs Lab 08/14/15 0117 08/14/15 0730 08/15/15 0450  WBC 7.1 5.1 4.3  HGB 12.3 10.9* 10.1*  HCT 37.7 34.2* 31.2*  PLT 194 170 145*  MCV 78.2 78.8 79.4  MCH 25.5* 25.1* 25.7*  MCHC 32.6 31.9 32.4  RDW 13.7 13.8 14.1  LYMPHSABS 1.3 1.6  --   MONOABS 0.5 0.3  --   EOSABS 0.0 0.0  --   BASOSABS 0.0 0.0  --     Chemistries   Recent Labs Lab 08/14/15 0117 08/14/15 0730 08/15/15 0450 08/16/15 0535 08/17/15 0633  NA 139 138 139 139 140  K 3.6 3.9 3.7 3.4* 3.7  CL 104 109 111 109 110  CO2 24 24 21* 25 24  GLUCOSE 115* 120* 75 110* 108*  BUN <5* <5*  CREATININE 0.97 0.81 0.87 0.91 0.92  CALCIUM 9.1 8.3* 8.1* 8.2* 8.6*  AST 330* 203* 59* 28 24  ALT 291* 247* 158* 111* 88*  ALKPHOS 61 54 46 49 47  BILITOT 2.8* 2.1* 1.1 0.9 0.7   ------------------------------------------------------------------------------------------------------------------ estimated creatinine clearance is 72 mL/min (by C-G formula based on Cr of  0.92). ------------------------------------------------------------------------------------------------------------------ No results for input(s): HGBA1C in the last 72 hours. ------------------------------------------------------------------------------------------------------------------ No results for input(s): CHOL, HDL, LDLCALC, TRIG, CHOLHDL, LDLDIRECT in the last 72 hours. ------------------------------------------------------------------------------------------------------------------ No results for input(s): TSH, T4TOTAL, T3FREE, THYROIDAB in the last 72 hours.  Invalid input(s): FREET3 ------------------------------------------------------------------------------------------------------------------ No results for input(s): VITAMINB12, FOLATE, FERRITIN, TIBC, IRON, RETICCTPCT in the last 72 hours.  Coagulation profile No results for input(s): INR, PROTIME in the last 168 hours.  No results for input(s): DDIMER in the last 72 hours.  Cardiac Enzymes No results for input(s): CKMB, TROPONINI, MYOGLOBIN in the last 168 hours.  Invalid input(s): CK ------------------------------------------------------------------------------------------------------------------ Invalid input(s): POCBNP     Time Spent in minutes   25 minutes   Tranae Laramie M.D on 08/17/2015 at 2:15 PM  Between  7am to 7pm - Pager - [phone]  After 7pm go to [email or web address] - password [facility]  [facility]   Office  [phone]

## 2015-08-17 NOTE — Progress Notes (Signed)
It is not clear the orders for her ERCP tomorrow appear correctly in the depot and therefore could be scheduled appropriately so they will be reordered. It will be sometime tomorrow by Dr. Dulce Sellarutlaw pending anesthesia schedule

## 2015-08-17 NOTE — Consult Note (Signed)
Reason for Consult:biliary pancreatitis Referring Physician: Laurence Spates  [name] is an 24 y.o. female.  HPI: [name] was admitted to the hospitalist service on 08/14/15 with biliary pancreatitis. Lipase was quite elevated time. Ultrasound showed cholelithiasis and choledocholithiasis. She has been treated medically for her pancreatitis and this has improved. Dr. Oletta Lamas asked [place] to consult for consideration of cholecystectomy at time of planned ERCP on 12/27. Her abdominal pain has resolved and she is tolerating clear liquids.  Past Medical History  Diagnosis Date  . DIABETES MELLITUS, GESTATIONAL, BORDERLINE 11/26/2007    Qualifier: History of  By: Carlena Sax  MD, Colletta [place]    . PLACENTA ABRUPTIO 06/25/2010    Qualifier: Diagnosis of  By: Verdie Drown MD, Levada Dy      History reviewed. No pertinent past surgical history.  Family History  Problem Relation Age of Onset  . Hypertension Other     Social History:  reports that she has never smoked. She does not have any smokeless tobacco history on file. She reports that she drinks alcohol. She reports that she does not use illicit drugs.  Allergies: No Known Allergies  Medications:  Scheduled: . acidophilus  2 capsule Oral Daily  . enoxaparin (LOVENOX) injection  40 mg Subcutaneous Q24H  . Influenza vac split quadrivalent PF  0.5 mL Intramuscular Tomorrow-1000  . piperacillin-tazobactam (ZOSYN)  IV  3.375 g Intravenous 3 times per day   Continuous: . sodium chloride 50 mL/hr at 08/15/15 0830  . sodium chloride    . dextrose 5 % and 0.45% NaCl 75 mL/hr at 08/17/15 0200   YTK:PTWSFKCL injection, ondansetron **OR** ondansetron (ZOFRAN) IV  Results for orders placed or performed during the hospital encounter of 08/14/15 (from the past 48 hour(s))  Glucose, capillary     Status: Abnormal   Collection Time: 08/15/15  4:23 PM  Result Value Ref Range   Glucose-Capillary 59 (L) 65 - 99 mg/dL  Glucose, capillary     Status: Abnormal   Collection  Time: 08/15/15  5:51 PM  Result Value Ref Range   Glucose-Capillary 153 (H) 65 - 99 mg/dL  Glucose, capillary     Status: None   Collection Time: 08/16/15 12:35 AM  Result Value Ref Range   Glucose-Capillary 81 65 - 99 mg/dL  Comprehensive metabolic panel     Status: Abnormal   Collection Time: 08/16/15  5:35 AM  Result Value Ref Range   Sodium 139 135 - 145 mmol/L   Potassium 3.4 (L) 3.5 - 5.1 mmol/L   Chloride 109 101 - 111 mmol/L   CO2 25 22 - 32 mmol/L   Glucose, Bld 110 (H) 65 - 99 mg/dL   BUN <5 (L) 6 - 20 mg/dL   Creatinine, Ser 0.91 0.44 - 1.00 mg/dL   Calcium 8.2 (L) 8.9 - 10.3 mg/dL   Total Protein 5.8 (L) 6.5 - 8.1 g/dL   Albumin 2.9 (L) 3.5 - 5.0 g/dL   AST 28 15 - 41 U/L   ALT 111 (H) 14 - 54 U/L   Alkaline Phosphatase 49 38 - 126 U/L   Total Bilirubin 0.9 0.3 - 1.2 mg/dL   GFR calc non Af Amer >60 >60 mL/min   GFR calc Af Amer >60 >60 mL/min    Comment: (NOTE) The eGFR has been calculated using the CKD EPI equation. This calculation has not been validated in all clinical situations. eGFR's persistently <60 mL/min signify possible Chronic Kidney Disease.    Anion gap 5 5 - 15  Lipase,  blood     Status: Abnormal   Collection Time: 08/16/15  5:35 AM  Result Value Ref Range   Lipase 81 (H) 11 - 51 U/L  Glucose, capillary     Status: None   Collection Time: 08/16/15  6:01 AM  Result Value Ref Range   Glucose-Capillary 97 65 - 99 mg/dL  Glucose, capillary     Status: Abnormal   Collection Time: 08/16/15 12:00 PM  Result Value Ref Range   Glucose-Capillary 106 (H) 65 - 99 mg/dL  Glucose, capillary     Status: None   Collection Time: 08/16/15  5:22 PM  Result Value Ref Range   Glucose-Capillary 77 65 - 99 mg/dL  Glucose, capillary     Status: None   Collection Time: 08/17/15  1:55 AM  Result Value Ref Range   Glucose-Capillary 99 65 - 99 mg/dL  Glucose, capillary     Status: Abnormal   Collection Time: 08/17/15  6:08 AM  Result Value Ref Range    Glucose-Capillary 103 (H) 65 - 99 mg/dL  Comprehensive metabolic panel     Status: Abnormal   Collection Time: 08/17/15  6:33 AM  Result Value Ref Range   Sodium 140 135 - 145 mmol/L   Potassium 3.7 3.5 - 5.1 mmol/L   Chloride 110 101 - 111 mmol/L   CO2 24 22 - 32 mmol/L   Glucose, Bld 108 (H) 65 - 99 mg/dL   BUN <5 (L) 6 - 20 mg/dL   Creatinine, Ser 0.92 0.44 - 1.00 mg/dL   Calcium 8.6 (L) 8.9 - 10.3 mg/dL   Total Protein 5.9 (L) 6.5 - 8.1 g/dL   Albumin 3.2 (L) 3.5 - 5.0 g/dL   AST 24 15 - 41 U/L   ALT 88 (H) 14 - 54 U/L   Alkaline Phosphatase 47 38 - 126 U/L   Total Bilirubin 0.7 0.3 - 1.2 mg/dL   GFR calc non Af Amer >60 >60 mL/min   GFR calc Af Amer >60 >60 mL/min    Comment: (NOTE) The eGFR has been calculated using the CKD EPI equation. This calculation has not been validated in all clinical situations. eGFR's persistently <60 mL/min signify possible Chronic Kidney Disease.    Anion gap 6 5 - 15  Lipase, blood     Status: Abnormal   Collection Time: 08/17/15  6:33 AM  Result Value Ref Range   Lipase 94 (H) 11 - 51 U/L    No results found.  Review of Systems  Constitutional: Negative.   Eyes: Negative.   Respiratory: Negative for cough and shortness of breath.   Cardiovascular: Negative for chest pain.  Gastrointestinal: Positive for abdominal pain. Negative for nausea, vomiting and diarrhea.  Genitourinary: Negative.   Musculoskeletal: Negative.   Skin: Negative.   Neurological: Negative.   Endo/Heme/Allergies: Negative.   Psychiatric/Behavioral: Negative.    Blood pressure 94/51, pulse 58, temperature 98.4 F (36.9 C), temperature source Oral, resp. rate 20, height 5' (1.524 m), weight 54.8 kg (120 lb 13 oz), last menstrual period 08/13/2015, SpO2 100 %. Physical Exam  Constitutional: She is oriented to person, place, and time. She appears well-developed and well-nourished. No distress.  HENT:  Head: Normocephalic and atraumatic.  Right Ear: External ear  normal.  Left Ear: External ear normal.  Nose: Nose normal.  Mouth/Throat: Oropharynx is clear and moist.  Eyes: EOM are normal. Pupils are equal, round, and reactive to light.  Neck: Neck supple. No tracheal deviation present.  Cardiovascular: Normal  rate, regular rhythm, normal heart sounds and intact distal pulses.   Respiratory: Effort normal and breath sounds normal. No stridor. No respiratory distress. She has no wheezes. She has no rales.  GI: Soft. She exhibits no distension. There is tenderness. There is no rebound and no guarding.  Minimal left upper quadrant tenderness  Musculoskeletal: Normal range of motion. She exhibits no edema or tenderness.  Neurological: She is alert and oriented to person, place, and time. She exhibits normal muscle tone.  Skin: Skin is warm.  Psychiatric: She has a normal mood and affect.    Assessment/Plan: Cholelithiasis, choledocholithiasis, biliary pancreatitis - agree with proceeding with combined procedure ERCP/arthroscopic cholecystectomy under one anesthetic tomorrow. NPO after midnight. We will follow-up her labs in the morning. I will discuss her case with Dr. Donne Hazel. I did discuss the laparoscopic cholecystectomy procedure, risks and benefits with her and her husband and answered her questions. She is agreeable.  Jovonte Commins E 08/17/2015, 9:30 AM

## 2015-08-17 NOTE — Progress Notes (Signed)
[facility] PROGRESS NOTE Subjective patient feeling much better tolerating liquid diet has been seen by surgery  Objective: Vital signs in last 24 hours: Temp:  [98 F (36.7 C)-98.4 F (36.9 C)] 98.2 F (36.8 C) (12/26 0800) Pulse Rate:  [58-72] 72 (12/26 0800) Resp:  [18-20] 18 (12/26 0800) BP: (94-103)/(41-58) 101/55 mmHg (12/26 0800) SpO2:  [100 %] 100 % (12/26 0800) Weight:  [54.8 kg (120 lb 13 oz)] 54.8 kg (120 lb 13 oz) (12/25 2042) Last BM Date: 08/16/15  Intake/Output from previous day: 12/25 0701 - 12/26 0700 In: 240 [P.O.:240] Out: 0  Intake/Output this shift:    PE: General-- no acute distress minimal epigastric tenderness bowel sounds Abdomen--  Lab Results:  Recent Labs  08/15/15 0450  WBC 4.3  HGB 10.1*  HCT 31.2*  PLT 145*   BMET  Recent Labs  08/15/15 0450 08/16/15 0535 08/17/15 0633  NA 139 139 140  K 3.7 3.4* 3.7  CL 111 109 110  CO2 21* 25 24  CREATININE 0.87 0.91 0.92   LFT  Recent Labs  08/15/15 0450 08/16/15 0535 08/17/15 0633  PROT 5.5* 5.8* 5.9*  AST 59* 28 24  ALT 158* 111* 88*  ALKPHOS 46 49 47  BILITOT 1.1 0.9 0.7  BILIDIR 0.2  --   --   IBILI 0.9  --   --    PT/INR No results for input(s): LABPROT, INR in the last 72 hours. PANCREAS  Recent Labs  08/15/15 0450 08/16/15 0535 08/17/15 0633  LIPASE 203* 81* 94*         Studies/Results: No results found.  Medications: I have reviewed the patient's current medications.  Assessment/Plan: 1. Now gallstone pancreatitis with cholelithiasis. Choledocholithiasis ultrasound. LFT still slightly elevated. Patient clinically much better and is scheduled for ERCP with sphincterotomy and stone extraction by Dr. Dulce Sellarutlaw tomorrow. Have discussed again with patient and family aware of the risk of the procedure anxious to proceed. Patient will likely go for laparoscopic cholecystectomy immediately afterwards   Viva Gallaher JR,Ninfa Giannelli L 08/17/2015, 11:14  AM  Pager: [phone] If no answer or after hours call [phone]

## 2015-08-18 ENCOUNTER — Encounter (HOSPITAL_COMMUNITY)
Admission: EM | Disposition: A | Discharge status: Home / Self Care | Payer: Self-pay | Source: Home / Self Care | Attending: Internal Medicine

## 2015-08-18 ENCOUNTER — Inpatient Hospital Stay (HOSPITAL_COMMUNITY): Payer: Self-pay | Admitting: Certified Registered Nurse Anesthetist

## 2015-08-18 ENCOUNTER — Inpatient Hospital Stay (HOSPITAL_COMMUNITY): Payer: Medicaid Other

## 2015-08-18 ENCOUNTER — Encounter (HOSPITAL_COMMUNITY): Payer: Self-pay | Admitting: Certified Registered Nurse Anesthetist

## 2015-08-18 HISTORY — PX: CHOLECYSTECTOMY: SHX55

## 2015-08-18 LAB — COMPREHENSIVE METABOLIC PANEL
ALT: 72 U/L — ABNORMAL HIGH (ref 14–54)
ANION GAP: 7 (ref 5–15)
AST: 22 U/L (ref 15–41)
Albumin: 3.3 g/dL — ABNORMAL LOW (ref 3.5–5.0)
Alkaline Phosphatase: 47 U/L (ref 38–126)
BUN: <5 mg/dL — ABNORMAL LOW (ref 6–20)
CHLORIDE: 108 mmol/L (ref 101–111)
CO2: 24 mmol/L (ref 22–32)
Calcium: 8.7 mg/dL — ABNORMAL LOW (ref 8.9–10.3)
Creatinine, Ser: 0.86 mg/dL (ref 0.44–1.00)
Glucose, Bld: 100 mg/dL — ABNORMAL HIGH (ref 65–99)
POTASSIUM: 3.6 mmol/L (ref 3.5–5.1)
Sodium: 139 mmol/L (ref 135–145)
Total Bilirubin: 0.5 mg/dL (ref 0.3–1.2)
Total Protein: 6.3 g/dL — ABNORMAL LOW (ref 6.5–8.1)

## 2015-08-18 LAB — BASIC METABOLIC PANEL
Anion gap: 7 (ref 5–15)
CALCIUM: 9.1 mg/dL (ref 8.9–10.3)
CO2: 26 mmol/L (ref 22–32)
CREATININE: 0.86 mg/dL (ref 0.44–1.00)
Chloride: 107 mmol/L (ref 101–111)
GFR calc Af Amer: >60 mL/min (ref >60)
Glucose, Bld: 114 mg/dL — ABNORMAL HIGH (ref 65–99)
Potassium: 4.2 mmol/L (ref 3.5–5.1)
SODIUM: 140 mmol/L (ref 135–145)

## 2015-08-18 LAB — GLUCOSE, CAPILLARY
GLUCOSE-CAPILLARY: 101 mg/dL — AB (ref 65–99)
GLUCOSE-CAPILLARY: 107 mg/dL — AB (ref 65–99)
GLUCOSE-CAPILLARY: 112 mg/dL — AB (ref 65–99)
GLUCOSE-CAPILLARY: 89 mg/dL (ref 65–99)
Glucose-Capillary: 122 mg/dL — ABNORMAL HIGH (ref 65–99)

## 2015-08-18 LAB — LIPASE, BLOOD: LIPASE: 81 U/L — AB (ref 11–51)

## 2015-08-18 SURGERY — LAPAROSCOPIC CHOLECYSTECTOMY WITH INTRAOPERATIVE CHOLANGIOGRAM
Anesthesia: General | Site: Abdomen

## 2015-08-18 SURGERY — ERCP, WITH INTERVENTION IF INDICATED
Anesthesia: Monitor Anesthesia Care

## 2015-08-18 MED ORDER — GLYCOPYRROLATE 0.2 MG/ML IJ SOLN
INTRAMUSCULAR | Status: DC | PRN
  Administered 2015-08-18: 0.6 mg via INTRAVENOUS

## 2015-08-18 MED ORDER — ROCURONIUM BROMIDE 100 MG/10ML IV SOLN
INTRAVENOUS | Status: DC | PRN
  Administered 2015-08-18: 40 mg via INTRAVENOUS

## 2015-08-18 MED ORDER — ONDANSETRON HCL 4 MG/2ML IJ SOLN
INTRAMUSCULAR | Status: DC | PRN
  Administered 2015-08-18: 4 mg via INTRAVENOUS

## 2015-08-18 MED ORDER — SUCCINYLCHOLINE CHLORIDE 20 MG/ML IJ SOLN
INTRAMUSCULAR | Status: AC
  Filled 2015-08-18: qty 1

## 2015-08-18 MED ORDER — ROCURONIUM BROMIDE 50 MG/5ML IV SOLN
INTRAVENOUS | Status: AC
  Filled 2015-08-18: qty 1

## 2015-08-18 MED ORDER — FENTANYL CITRATE (PF) 100 MCG/2ML IJ SOLN
INTRAMUSCULAR | Status: DC | PRN
  Administered 2015-08-18 (×3): 50 ug via INTRAVENOUS
  Administered 2015-08-18: 100 ug via INTRAVENOUS

## 2015-08-18 MED ORDER — LIDOCAINE HCL (CARDIAC) 20 MG/ML IV SOLN
INTRAVENOUS | Status: AC
  Filled 2015-08-18: qty 5

## 2015-08-18 MED ORDER — GLYCOPYRROLATE 0.2 MG/ML IJ SOLN
INTRAMUSCULAR | Status: AC
  Filled 2015-08-18: qty 3

## 2015-08-18 MED ORDER — NEOSTIGMINE METHYLSULFATE 10 MG/10ML IV SOLN
INTRAVENOUS | Status: AC
Start: 2015-08-18 — End: 2015-08-18
  Filled 2015-08-18: qty 1

## 2015-08-18 MED ORDER — MIDAZOLAM HCL 2 MG/2ML IJ SOLN
INTRAMUSCULAR | Status: AC
  Filled 2015-08-18: qty 2

## 2015-08-18 MED ORDER — FENTANYL CITRATE (PF) 250 MCG/5ML IJ SOLN
INTRAMUSCULAR | Status: AC
  Filled 2015-08-18: qty 5

## 2015-08-18 MED ORDER — BUPIVACAINE-EPINEPHRINE (PF) 0.25% -1:200000 IJ SOLN
INTRAMUSCULAR | Status: AC
  Filled 2015-08-18: qty 30

## 2015-08-18 MED ORDER — EPHEDRINE SULFATE 50 MG/ML IJ SOLN
INTRAMUSCULAR | Status: AC
  Filled 2015-08-18: qty 1

## 2015-08-18 MED ORDER — MIDAZOLAM HCL 5 MG/5ML IJ SOLN
INTRAMUSCULAR | Status: DC | PRN
  Administered 2015-08-18: 2 mg via INTRAVENOUS

## 2015-08-18 MED ORDER — 0.9 % SODIUM CHLORIDE (POUR BTL) OPTIME
TOPICAL | Status: DC | PRN
  Administered 2015-08-18: 1000 mL

## 2015-08-18 MED ORDER — STERILE WATER FOR INJECTION IJ SOLN
INTRAMUSCULAR | Status: AC
  Filled 2015-08-18: qty 10

## 2015-08-18 MED ORDER — OXYCODONE-ACETAMINOPHEN 5-325 MG PO TABS
1.0000 | ORAL_TABLET | ORAL | Status: DC | PRN
  Administered 2015-08-18 – 2015-08-19 (×3): 2 via ORAL
  Filled 2015-08-18 (×3): qty 2

## 2015-08-18 MED ORDER — LACTATED RINGERS IV SOLN
INTRAVENOUS | Status: DC
  Administered 2015-08-18: 10:00:00 via INTRAVENOUS

## 2015-08-18 MED ORDER — BUPIVACAINE-EPINEPHRINE 0.25% -1:200000 IJ SOLN
INTRAMUSCULAR | Status: DC | PRN
  Administered 2015-08-18: 9 mL

## 2015-08-18 MED ORDER — HYDROMORPHONE HCL 1 MG/ML IJ SOLN
INTRAMUSCULAR | Status: AC
  Administered 2015-08-18: 0.5 mg via INTRAVENOUS
  Filled 2015-08-18: qty 1

## 2015-08-18 MED ORDER — PROPOFOL 10 MG/ML IV BOLUS
INTRAVENOUS | Status: DC | PRN
  Administered 2015-08-18: 160 mg via INTRAVENOUS

## 2015-08-18 MED ORDER — NEOSTIGMINE METHYLSULFATE 10 MG/10ML IV SOLN
INTRAVENOUS | Status: DC | PRN
  Administered 2015-08-18: 4 mg via INTRAVENOUS

## 2015-08-18 MED ORDER — OXYCODONE HCL 5 MG PO TABS: 5.0000 mg | ORAL_TABLET | Freq: Once | ORAL | Status: DC | PRN

## 2015-08-18 MED ORDER — ONDANSETRON HCL 4 MG/2ML IJ SOLN: 4.0000 mg | Freq: Four times a day (QID) | INTRAMUSCULAR | Status: DC | PRN

## 2015-08-18 MED ORDER — LIDOCAINE HCL (CARDIAC) 20 MG/ML IV SOLN
INTRAVENOUS | Status: DC | PRN
  Administered 2015-08-18: 60 mg via INTRAVENOUS

## 2015-08-18 MED ORDER — SODIUM CHLORIDE 0.9 % IR SOLN
Status: DC | PRN
Start: 2015-08-18 — End: 2015-08-18
  Administered 2015-08-18: 1000 mL

## 2015-08-18 MED ORDER — SODIUM CHLORIDE 0.9 % IV SOLN
INTRAVENOUS | Status: DC | PRN
  Administered 2015-08-18: 8 mL

## 2015-08-18 MED ORDER — OXYCODONE HCL 5 MG/5ML PO SOLN: 5.0000 mg | Freq: Once | ORAL | Status: DC | PRN

## 2015-08-18 MED ORDER — HYDROMORPHONE HCL 1 MG/ML IJ SOLN
0.2500 mg | INTRAMUSCULAR | Status: DC | PRN
  Administered 2015-08-18 (×4): 0.5 mg via INTRAVENOUS

## 2015-08-18 MED ORDER — DEXAMETHASONE SODIUM PHOSPHATE 4 MG/ML IJ SOLN
INTRAMUSCULAR | Status: DC | PRN
  Administered 2015-08-18: 4 mg via INTRAVENOUS

## 2015-08-18 MED ORDER — PROPOFOL 10 MG/ML IV BOLUS
INTRAVENOUS | Status: AC
  Filled 2015-08-18: qty 20

## 2015-08-18 MED ORDER — DEXTROSE 5 % IV SOLN
1.0000 g | INTRAVENOUS | Status: DC
  Filled 2015-08-18: qty 1

## 2015-08-18 MED ORDER — LACTATED RINGERS IV SOLN
INTRAVENOUS | Status: DC | PRN
  Administered 2015-08-18 (×2): via INTRAVENOUS

## 2015-08-18 SURGICAL SUPPLY — 44 items
APPLIER CLIP 5 13 M/L LIGAMAX5 (MISCELLANEOUS) ×2
BLADE SURG ROTATE 9660 (MISCELLANEOUS) IMPLANT
CANISTER SUCTION 2500CC (MISCELLANEOUS) ×2 IMPLANT
CHLORAPREP W/TINT 26ML (MISCELLANEOUS) ×2 IMPLANT
CLIP APPLIE 5 13 M/L LIGAMAX5 (MISCELLANEOUS) ×1 IMPLANT
COVER MAYO STAND STRL (DRAPES) ×2 IMPLANT
COVER SURGICAL LIGHT HANDLE (MISCELLANEOUS) ×2 IMPLANT
DECANTER SPIKE VIAL GLASS SM (MISCELLANEOUS) ×2 IMPLANT
DEVICE TROCAR PUNCTURE CLOSURE (ENDOMECHANICALS) ×2 IMPLANT
DRAPE C-ARM 42X72 X-RAY (DRAPES) ×2 IMPLANT
DRAPE LAPAROSCOPIC ABDOMINAL (DRAPES) ×2 IMPLANT
ELECT REM PT RETURN 9FT ADLT (ELECTROSURGICAL) ×2
ELECTRODE REM PT RTRN 9FT ADLT (ELECTROSURGICAL) ×1 IMPLANT
GLOVE BIO SURGEON STRL SZ7 (GLOVE) ×2 IMPLANT
GLOVE BIOGEL PI IND STRL 7.0 (GLOVE) ×1 IMPLANT
GLOVE BIOGEL PI IND STRL 7.5 (GLOVE) ×1 IMPLANT
GLOVE BIOGEL PI IND STRL 8 (GLOVE) ×1 IMPLANT
GLOVE BIOGEL PI INDICATOR 7.0 (GLOVE) ×1
GLOVE BIOGEL PI INDICATOR 7.5 (GLOVE) ×1
GLOVE BIOGEL PI INDICATOR 8 (GLOVE) ×1
GLOVE SURG SS PI 7.5 STRL IVOR (GLOVE) ×2 IMPLANT
GOWN STRL REUS W/ TWL LRG LVL3 (GOWN DISPOSABLE) ×2 IMPLANT
GOWN STRL REUS W/TWL LRG LVL3 (GOWN DISPOSABLE) ×2
KIT BASIN OR (CUSTOM PROCEDURE TRAY) ×2 IMPLANT
KIT ROOM TURNOVER OR (KITS) ×2 IMPLANT
LIQUID BAND (GAUZE/BANDAGES/DRESSINGS) ×2 IMPLANT
NS IRRIG 1000ML POUR BTL (IV SOLUTION) ×2 IMPLANT
PAD ARMBOARD 7.5X6 YLW CONV (MISCELLANEOUS) ×2 IMPLANT
POUCH RETRIEVAL ECOSAC 10 (ENDOMECHANICALS) ×1 IMPLANT
POUCH RETRIEVAL ECOSAC 10MM (ENDOMECHANICALS) ×1
SCISSORS LAP 5X35 DISP (ENDOMECHANICALS) ×2 IMPLANT
SET CHOLANGIOGRAPH 5 50 .035 (SET/KITS/TRAYS/PACK) ×2 IMPLANT
SET IRRIG TUBING LAPAROSCOPIC (IRRIGATION / IRRIGATOR) ×2 IMPLANT
SLEEVE ENDOPATH XCEL 5M (ENDOMECHANICALS) ×4 IMPLANT
SPECIMEN JAR SMALL (MISCELLANEOUS) ×2 IMPLANT
STRIP CLOSURE SKIN 1/2X4 (GAUZE/BANDAGES/DRESSINGS) ×2 IMPLANT
SUT MNCRL AB 4-0 PS2 18 (SUTURE) ×2 IMPLANT
SUT VICRYL 0 UR6 27IN ABS (SUTURE) ×2 IMPLANT
TOWEL OR 17X24 6PK STRL BLUE (TOWEL DISPOSABLE) ×2 IMPLANT
TOWEL OR 17X26 10 PK STRL BLUE (TOWEL DISPOSABLE) ×2 IMPLANT
TRAY LAPAROSCOPIC MC (CUSTOM PROCEDURE TRAY) ×2 IMPLANT
TROCAR XCEL BLUNT TIP 100MML (ENDOMECHANICALS) ×2 IMPLANT
TROCAR XCEL NON-BLD 5MMX100MML (ENDOMECHANICALS) ×2 IMPLANT
TUBING INSUFFLATION (TUBING) ×2 IMPLANT

## 2015-08-18 NOTE — Progress Notes (Signed)
  Subjective: No abd pain  Objective: Vital signs in last 24 hours: Temp:  [98.1 F (36.7 C)-98.2 F (36.8 C)] 98.2 F (36.8 C) (12/27 0500) Pulse Rate:  [63-72] 63 (12/27 0500) Resp:  [16-18] 16 (12/27 0500) BP: (94-102)/(55-60) 94/56 mmHg (12/27 0500) SpO2:  [100 %] 100 % (12/27 0500) Weight:  [53.162 kg (117 lb 3.2 oz)] 53.162 kg (117 lb 3.2 oz) (12/26 2100) Last BM Date: 08/16/15  Intake/Output from previous day: 12/26 0701 - 12/27 0700 In: 1110 [P.O.:120; I.V.:890; IV Piggyback:100] Out: 0  Intake/Output this shift:    abd nt/nd   Lab Results:  No results for input(s): WBC, HGB, HCT, PLT in the last 72 hours. BMET  Recent Labs  08/16/15 0535 08/17/15 0633  NA 139 140  K 3.4* 3.7  CL 109 110  CO2 25 24  GLUCOSE 110* 108*  BUN <5* <5*  CREATININE 0.91 0.92  CALCIUM 8.2* 8.6*   PT/INR No results for input(s): LABPROT, INR in the last 72 hours. ABG No results for input(s): PHART, HCO3 in the last 72 hours.  Invalid input(s): PCO2, PO2  Studies/Results: No results found.  Anti-infectives: Anti-infectives    Start     Dose/Rate Route Frequency Ordered Stop   08/18/15 0800  cefOXitin (MEFOXIN) 1 g in dextrose 5 % 50 mL IVPB    Comments:  Send to or with patient   1 g 100 mL/hr over 30 Minutes Intravenous  Once 08/18/15 0757     08/14/15 0645  piperacillin-tazobactam (ZOSYN) IVPB 3.375 g     3.375 g 12.5 mL/hr over 240 Minutes Intravenous 3 times per day 08/14/15 [id number]        Assessment/Plan: Gallstones Plan lap chole today, procedure/risks discussed, will do ioc  [facility]ent[facility] Rehabilitation HospitalWAKEFIELD,Christine Rinks 08/18/2015

## 2015-08-18 NOTE — Anesthesia Procedure Notes (Signed)
Procedure Name: Intubation Date/Time: 08/18/2015 11:08 AM Performed by: Reine JustFLOWERS, Torah Pinnock T Pre-anesthesia Checklist: Patient identified, Emergency Drugs available, Suction available, Patient being monitored and Timeout performed Patient Re-evaluated:Patient Re-evaluated prior to inductionOxygen Delivery Method: Circle system utilized and Simple face mask Preoxygenation: Pre-oxygenation with 100% oxygen Intubation Type: IV induction Ventilation: Mask ventilation without difficulty Laryngoscope Size: Miller and 2 Grade View: Grade I Tube type: Oral Tube size: 7.5 mm Number of attempts: 1 Airway Equipment and Method: Patient positioned with wedge pillow and Stylet Placement Confirmation: ETT inserted through vocal cords under direct vision,  positive ETCO2 and breath sounds checked- equal and bilateral Secured at: 22 cm Tube secured with: Tape Dental Injury: Teeth and Oropharynx as per pre-operative assessment

## 2015-08-18 NOTE — Progress Notes (Signed)
No stones in bile duct on intraoperative cholangiogram.  [facility] will sign-off; please call with questions; thank you for the consultation.

## 2015-08-18 NOTE — Op Note (Signed)
Preoperative diagnosis: gallstone pancreatitis Postoperative diagnosis: saa Procedure: laparoscopic cholecystectomy with cholangiogram Surgeon: Dr Harden MoMatt Lamaria Hildebrandt Anesthesia: general EBL: minimal Drains none Specimen gb and contents to pathology Complications: none Sponge count correct at completion Disposition to recovery stable  Indications: This is an [age] yof with gallstones and episode of pancreatitis that has resolved now.  We discussed proceeding with lap chole.   Procedure: After informed consent was obtained the patient was taken to the operating room. She was already on antibiotics. Sequential compression devices were on her legs. She was placed under general anesthesia without complication. Her abdomen was prepped and draped in the standard sterile surgical fashion. A surgical timeout was then performed.  I infiltrated marcaine below the umbilicus. I made an incision and then entered the fascia sharply. I then entered the peritoneum bluntly. There was no evidence of an entry injury. I placed a 0 vicryl pursestring suture and inserted a hasson trocar. I then inserted 3 further 5 mm trocars in the epigastrium and ruq. I was able to retract the gallbladder cephalad and lateral.  Eventually I was able to identify the cystic duct and clearly had the critical view of safety. I was able to visualize the CBD well also. I then clipped the cystic duct distally. I made a ductotomy and introduced a catheter. A cholangiogram was performed with visualization of both sides of the liver, I was in cystic duct, contrast flowed into duodenum and I didn't see any stones. I removed the catheter. I then clipped the duct and divided it. The duct was viable and the clips traversed the duct.  I then clipped the cystic artery and divided it. I then removed the gallbladder from the liver bed and placed it in a bag. It was then removed from the umbilical incision. I then obtained hemostasis and  irrigated.. I then removed the umbilical trocar and closed with 0 vicryl and the endoclose device after tying down the pursestring.  I then desufflated the abdomen and removed all my remaining trocars. I then closed these with 4-0 Monocryl and Dermabond. She tolerated this well was extubated and transferred to the recovery room in stable condition

## 2015-08-18 NOTE — Progress Notes (Signed)
Pt received from surgery. VSS. PRN pain medication given. Surgical sites CDI. Will continue to monitor pt closely. Jillyn HiddenStone,Saroya Riccobono R, RN

## 2015-08-18 NOTE — Anesthesia Preprocedure Evaluation (Addendum)
Anesthesia Evaluation  Patient identified by MRN, date of birth, ID band Patient awake    Reviewed: Allergy & Precautions, NPO status , Patient's Chart, lab work & pertinent test results  Airway Mallampati: I  TM Distance: >3 FB Neck ROM: Full    Dental  (+) Teeth Intact, Dental Advisory Given   Pulmonary neg pulmonary ROS,    breath sounds clear to auscultation       Cardiovascular negative cardio ROS   Rhythm:regular Rate:Normal     Neuro/Psych    GI/Hepatic   Endo/Other    Renal/GU      Musculoskeletal   Abdominal   Peds  Hematology   Anesthesia Other Findings   Reproductive/Obstetrics                            Anesthesia Physical Anesthesia Plan  ASA: I  Anesthesia Plan: General   Post-op Pain Management:    Induction: Intravenous  Airway Management Planned: Oral ETT  Additional Equipment:   Intra-op Plan:   Post-operative Plan: Extubation in OR  Informed Consent: I have reviewed the patients History and Physical, chart, labs and discussed the procedure including the risks, benefits and alternatives for the proposed anesthesia with the patient or authorized representative who has indicated his/her understanding and acceptance.   Dental advisory given  Plan Discussed with: CRNA, Anesthesiologist and Surgeon  Anesthesia Plan Comments:        Anesthesia Quick Evaluation

## 2015-08-18 NOTE — Progress Notes (Addendum)
Patient Demographics  [name], is a 24 y.o. female, DOB - 1991-04-03, ZOX:[id number]  Admit date - 08/14/2015   Admitting Physician Eduard Clos, MD  Outpatient Primary MD for the patient is Tawni Carnes, MD  LOS - 4   Chief Complaint  Patient presents with  . Chest Pain       Admission history of present illness/brief narrative: 24 year old female without significant past medical history, presents with complaints of abdominal pain, in ED workup was significant for elevated lipase more than 3000, elevated LFTs, right upper quadrant ultrasound significant for gallbladder stone and distal CBD stone, initially patient is nothing by mouth, on IV fluid, followed by GI , lipase significantly trending down, advanced to liquid diet, plan for ERCP and  laparoscopic cholecystectomy on 12/27, patient underwent laparoscopic cholecystectomy 12/27, no stones and bile duct on intraoperative cholangiogram, so no ERCP was requird.  Subjective:   [name] today has, No headache, No chest pain, No Cough - SOB,no vomiting, denies any abdominal pain,.  Assessment & Plan    Principal Problem:   Gallstone pancreatitis  Gallstone pancreatitis - Initially significantly elevated lipase > 3000, treated with conservative management initially, was nothing by mouth, on IV fluids, is trended down significantly, as well as elevated LFTs initially improved, advanced to clear liquid diet, which she tolerated very well, surgery were consulted, patient had laparoscopic cholecystectomy 12/26, no evidence of stones in bile duct on intraoperative cholangiogram, no ERCP done. - Stopped IV Zosyn - on Clear liquid diet and on IV fluids.  Code Status: Full  Family Communication: discussed with patient  Disposition Plan:home when stable   Procedures  - Laparoscopic cholecystectomy with IOC 12/27   Consults   GI Gen.  surgery  Medications  Scheduled Meds: . acidophilus  2 capsule Oral Daily  . enoxaparin (LOVENOX) injection  40 mg Subcutaneous Q24H   Continuous Infusions: . dextrose 5 % and 0.45% NaCl 75 mL/hr at 08/18/15 0343   PRN Meds:.morphine injection, ondansetron **OR** ondansetron (ZOFRAN) IV, oxyCODONE-acetaminophen  DVT Prophylaxis  Lovenox   Lab Results  Component Value Date   PLT 145* 08/15/2015    Antibiotics    Anti-infectives    Start     Dose/Rate Route Frequency Ordered Stop   08/18/15 1200  cefOXitin (MEFOXIN) 1 g in dextrose 5 % 50 mL IVPB  Status:  Discontinued    Comments:  Send to or with patient   1 g 100 mL/hr over 30 Minutes Intravenous To Short Stay 08/18/15 0757 08/18/15 1354   08/14/15 0645  piperacillin-tazobactam (ZOSYN) IVPB 3.375 g  Status:  Discontinued     3.375 g 12.5 mL/hr over 240 Minutes Intravenous 3 times per day 08/14/15 0619 08/18/15 1354          Objective:   Filed Vitals:   08/18/15 1300 08/18/15 1315 08/18/15 1330 08/18/15 1359  BP: 97/52  Pulse: 55 49 58 58  Temp:    98.1 F (36.7 C)  TempSrc:    Oral  Resp: Height:      Weight:      SpO2: 93% 100% 100% 100%    Wt Readings from Last 3 Encounters:  08/17/15 53.162 kg (117  lb 3.2 oz)  01/22/15 54.386 kg (119 lb 14.4 oz)  11/16/13 54.432 kg (120 lb)     Intake/Output Summary (Last 24 hours) at 08/18/15 1612 Last data filed at 08/18/15 1330  Gross per 24 hour  Intake   2425 ml  Output      0 ml  Net   2425 ml     Physical Exam (this am prior to surgery)  Awake Alert, Oriented X 3, No new F.N deficits, Normal affect Nelson.AT,PERRAL Supple Neck,No JVD, No cervical lymphadenopathy appriciated.  Symmetrical Chest wall movement, Good air movement bilaterally, CTAB RRR,No Gallops,Rubs or new Murmurs, No Parasternal Heave +ve B.Sounds, Abd Soft, no  tenderness, No organomegaly appriciated, No rebound - guarding or rigidity. No Cyanosis,  Clubbing or edema, No new Rash or bruise     Data Review   Micro Results No results found for this or any previous visit (from the past 240 hour(s)).  Radiology Reports Dg Cholangiogram Operative  08/18/2015  CLINICAL DATA:  Gallstones EXAM: INTRAOPERATIVE CHOLANGIOGRAM TECHNIQUE: Cholangiographic images from the C-arm fluoroscopic device were submitted for interpretation post-operatively. Please see the procedural report for the amount of contrast and the fluoroscopy time utilized. COMPARISON:  None. FINDINGS: Contrast fills the biliary tree and duodenum without filling defects in the common bile duct. IMPRESSION: Patent biliary tree. Electronically Signed   By: Jolaine ClickArthur  Hoss M.D.   On: 08/18/2015 14:10   [place] Abdomen Limited Ruq  08/14/2015  CLINICAL DATA:  Epigastric pain for 1 day EXAM: US ABDOMEN LIMITED - RIGHT UPPER QUADRANT COMPARISON:  None. FINDINGS: Gallbladder: Numerous layering gallstones. There is also a gallstone fixed within the gallbladder neck. There is no wall thickening or focal tenderness to suggest acute cholecystitis. Pericholecystic and peripancreatic edema. Bile ducts: Common bile duct measures 6 mm; there is mild diffuse intrahepatic ductal dilatation. Non shadowing but still convincing 4 mm defect within the distal common bile duct. Liver: No focal lesion identified. Within normal limits in parenchymal echogenicity. IMPRESSION: 1. Cholelithiasis including stone fixed in the gallbladder neck. 2. Distal CBD stone and biliary dilatation. 3. Pancreatitis. Electronically Signed   By: Marnee SpringJonathon  Watts M.D.   On: 08/14/2015 04:42     CBC  Recent Labs Lab 08/14/15 0117 08/14/15 0730 08/15/15 0450  WBC 7.1 5.1 4.3  HGB 12.3 10.9* 10.1*  HCT 37.7 34.2* 31.2*  PLT 194 170 145*  MCV 78.2 78.8 79.4  MCH 25.5* 25.1* 25.7*  MCHC 32.6 31.9 32.4  RDW 13.7 13.8 14.1  LYMPHSABS 1.3 1.6  --   MONOABS 0.5 0.3  --   EOSABS 0.0 0.0  --   BASOSABS 0.0 0.0  --     Chemistries    Recent Labs Lab 08/14/15 0730 08/15/15 0450 08/16/15 0535 08/17/15 0633 08/18/15 0720  NA 138 139 139 140 139  K 3.9 3.7 3.4* 3.7 3.6  CL 109 111 109 110 108  CO2 24 21* 25 24 24   GLUCOSE 120* 75 110* 108* 100*  BUN 8 8 <5* <5* <5*  CREATININE 0.81 0.87 0.91 0.92 0.86  CALCIUM 8.3* 8.1* 8.2* 8.6* 8.7*  AST 203* 59* 28 24 22   ALT 247* 158* 111* 88* 72*  ALKPHOS 54 46 49 47 47  BILITOT 2.1* 1.1 0.9 0.7 0.5   ------------------------------------------------------------------------------------------------------------------ estimated creatinine clearance is 71.2 mL/min (by C-G formula based on Cr of 0.86). ------------------------------------------------------------------------------------------------------------------ No results for input(s): HGBA1C in the last 72 hours. ------------------------------------------------------------------------------------------------------------------ No results for input(s): CHOL, HDL, LDLCALC, TRIG, CHOLHDL, LDLDIRECT  in the last 72 hours. ------------------------------------------------------------------------------------------------------------------ No results for input(s): TSH, T4TOTAL, T3FREE, THYROIDAB in the last 72 hours.  Invalid input(s): FREET3 ------------------------------------------------------------------------------------------------------------------ No results for input(s): VITAMINB12, FOLATE, FERRITIN, TIBC, IRON, RETICCTPCT in the last 72 hours.  Coagulation profile No results for input(s): INR, PROTIME in the last 168 hours.  No results for input(s): DDIMER in the last 72 hours.  Cardiac Enzymes No results for input(s): CKMB, TROPONINI, MYOGLOBIN in the last 168 hours.  Invalid input(s): CK ------------------------------------------------------------------------------------------------------------------ Invalid input(s): POCBNP     Time Spent in minutes   20 minutes   Ralph Brouwer M.D on 08/18/2015 at  4:12 PM  Between 7am to 7pm - Pager - [phone]  After 7pm go to [email or web address] - password [facility]  [facility]   Office  [phone]

## 2015-08-18 NOTE — Transfer of Care (Signed)
Immediate Anesthesia Transfer of Care Note  Patient: [name]  Procedure(s) Performed: Procedure(s): LAPAROSCOPIC CHOLECYSTECTOMY WITH INTRAOPERATIVE CHOLANGIOGRAM (N/A)  Patient Location: PACU  Anesthesia Type:General  Level of Consciousness: awake, alert  and oriented  Airway & Oxygen Therapy: Patient Spontanous Breathing and Patient connected to nasal cannula oxygen  Post-op Assessment: Report given to RN and Post -op Vital signs reviewed and stable  Post vital signs: Reviewed and stable  Last Vitals:  Filed Vitals:   08/18/15 0500 08/18/15 0911  BP: 94/56 105/57  Pulse: 63 62  Temp: 36.8 C 36.7 C  Resp: 16 16    Complications: No apparent anesthesia complications

## 2015-08-18 NOTE — Anesthesia Postprocedure Evaluation (Signed)
Anesthesia Post Note  Patient: [name]  Procedure(s) Performed: Procedure(s) (LRB): LAPAROSCOPIC CHOLECYSTECTOMY WITH INTRAOPERATIVE CHOLANGIOGRAM (N/A)  Patient location during evaluation: PACU Anesthesia Type: General Level of consciousness: awake and alert and patient cooperative Pain management: pain level controlled Vital Signs Assessment: post-procedure vital signs reviewed and stable Respiratory status: spontaneous breathing and respiratory function stable Cardiovascular status: stable Anesthetic complications: no    Last Vitals:  Filed Vitals:   08/18/15 1315 08/18/15 1330  BP: 92/63 92/63  Pulse: 49 58  Temp:    Resp: 8 19    Last Pain:  Filed Vitals:   08/18/15 1336  PainSc: 4                  Kody Brandl S

## 2015-08-19 ENCOUNTER — Encounter (HOSPITAL_COMMUNITY): Payer: Self-pay | Admitting: General Surgery

## 2015-08-19 ENCOUNTER — Encounter (HOSPITAL_COMMUNITY)
Admission: EM | Disposition: A | Discharge status: Home / Self Care | Payer: Medicaid Other | Source: Home / Self Care | Attending: Internal Medicine

## 2015-08-19 DIAGNOSIS — K851 Biliary acute pancreatitis without necrosis or infection: Principal | ICD-10-CM

## 2015-08-19 LAB — CBC
HCT: 35.9 % — ABNORMAL LOW (ref 36.0–46.0)
HEMOGLOBIN: 11.7 g/dL — AB (ref 12.0–15.0)
MCH: 25.9 pg — AB (ref 26.0–34.0)
MCHC: 32.6 g/dL (ref 30.0–36.0)
MCV: 79.6 fL (ref 78.0–100.0)
Platelets: 196 10*3/uL (ref 150–400)
RBC: 4.51 MIL/uL (ref 3.87–5.11)
RDW: 13.6 % (ref 11.5–15.5)
WBC: 6.2 10*3/uL (ref 4.0–10.5)

## 2015-08-19 LAB — GLUCOSE, CAPILLARY: GLUCOSE-CAPILLARY: 134 mg/dL — AB (ref 65–99)

## 2015-08-19 SURGERY — ERCP, WITH INTERVENTION IF INDICATED
Anesthesia: General

## 2015-08-19 MED ORDER — IBUPROFEN 600 MG PO TABS: 600.0000 mg | ORAL_TABLET | Freq: Four times a day (QID) | ORAL | Status: DC | PRN

## 2015-08-19 MED ORDER — OXYCODONE-ACETAMINOPHEN 5-325 MG PO TABS: 1.0000 | ORAL_TABLET | Freq: Four times a day (QID) | ORAL | Status: DC | PRN

## 2015-08-19 MED ORDER — RANITIDINE HCL 150 MG PO CAPS: 150.0000 mg | ORAL_CAPSULE | Freq: Every day | ORAL | Status: AC | PRN

## 2015-08-19 NOTE — Plan of Care (Signed)
Problem: Nutritional: Goal: Ability to achieve adequate nutritional intake will improve Outcome: Adequate for Discharge Eating about 50% of meals.

## 2015-08-19 NOTE — Progress Notes (Signed)
Patient discharged to home with family. PIV x2 removed per protocol. Discharge instructions and prescriptions reviewed with patient. Discussed treatment/care of abdominal incisions. Discussed s/s infection. Work note given to patient from Dr. Malachi BondsShort.  Leanna BattlesEckelmann, Angelmarie Ponzo Eileen, RN.

## 2015-08-19 NOTE — Progress Notes (Signed)
Patient ID: [name], female   DOB: April 15, 1991, 24 y.o.   MRN: [id number]     [address] [place], [place] [address]    Phone: [phone] FAX: [phone]     Subjective: Ambulating, sore, voiding, no flatus.   Objective:  Vital signs:  Filed Vitals:   08/18/15 1330 08/18/15 1359 08/18/15 2100 08/19/15 0500  BP: 92/63 97/52 81/43  94/51  Pulse: 58 58 73 53  Temp:  98.1 F (36.7 C) 98.3 F (36.8 C) 98.5 F (36.9 C)  TempSrc:  Oral Oral Oral  Resp: 19 19 18 16   Height:      Weight:   50.44 kg (111 lb 3.2 oz)   SpO2: 100% 100% 94% 99%    Last BM Date: 08/18/15 (before surgery)  Intake/Output   Yesterday:  12/27 0701 - 12/28 0700 In: 1915 [P.O.:840; I.V.:1075] Out: 2 [Urine:2] This shift:  Total I/O In: 240 [P.O.:240] Out: -   Physical Exam: General: Pt awake/alert/oriented x4 in no acute distress  Abdomen: Soft.  Nondistended.   Mildly tender at incisions only. Incisions with steri strips in place, no erythema or drainage.  No evidence of peritonitis.  No incarcerated hernias.   Problem List:   Principal Problem:   Gallstone pancreatitis    Results:   Labs: Results for orders placed or performed during the hospital encounter of 08/14/15 (from the past 48 hour(s))  Glucose, capillary     Status: None   Collection Time: 08/17/15 11:49 AM  Result Value Ref Range   Glucose-Capillary 86 65 - 99 mg/dL  Glucose, capillary     Status: None   Collection Time: 08/17/15  5:38 PM  Result Value Ref Range   Glucose-Capillary 83 65 - 99 mg/dL  Glucose, capillary     Status: None   Collection Time: 08/18/15 12:39 AM  Result Value Ref Range   Glucose-Capillary 89 65 - 99 mg/dL  Glucose, capillary     Status: Abnormal   Collection Time: 08/18/15  6:04 AM  Result Value Ref Range   Glucose-Capillary 112 (H) 65 - 99 mg/dL  Lipase, blood     Status: Abnormal   Collection Time: 08/18/15  7:20 AM   Result Value Ref Range   Lipase 81 (H) 11 - 51 U/L  Comprehensive metabolic panel     Status: Abnormal   Collection Time: 08/18/15  7:20 AM  Result Value Ref Range   Sodium 139 135 - 145 mmol/L   Potassium 3.6 3.5 - 5.1 mmol/L   Chloride 108 101 - 111 mmol/L   CO2 24 22 - 32 mmol/L   Glucose, Bld 100 (H) 65 - 99 mg/dL   BUN <5 (L) 6 - 20 mg/dL   Creatinine, Ser 0.86 0.44 - 1.00 mg/dL   Calcium 8.7 (L) 8.9 - 10.3 mg/dL   Total Protein 6.3 (L) 6.5 - 8.1 g/dL   Albumin 3.3 (L) 3.5 - 5.0 g/dL   AST 22 15 - 41 U/L   ALT 72 (H) 14 - 54 U/L   Alkaline Phosphatase 47 38 - 126 U/L   Total Bilirubin 0.5 0.3 - 1.2 mg/dL   GFR calc non Af Amer >60 >60 mL/min   GFR calc Af Amer >60 >60 mL/min    Comment: (NOTE) The eGFR has been calculated using the CKD EPI equation. This calculation has not been validated in all clinical situations. eGFR's persistently <60 mL/min signify possible  Chronic Kidney Disease.    Anion gap 7 5 - 15  Glucose, capillary     Status: Abnormal   Collection Time: 08/18/15  7:32 AM  Result Value Ref Range   Glucose-Capillary 122 (H) 65 - 99 mg/dL   Comment 1 Notify RN    Comment 2 Document in Chart   Glucose, capillary     Status: Abnormal   Collection Time: 08/18/15  9:32 AM  Result Value Ref Range   Glucose-Capillary 107 (H) 65 - 99 mg/dL  Glucose, capillary     Status: Abnormal   Collection Time: 08/18/15  2:38 PM  Result Value Ref Range   Glucose-Capillary 101 (H) 65 - 99 mg/dL  Basic metabolic panel     Status: Abnormal   Collection Time: 08/18/15  5:01 PM  Result Value Ref Range   Sodium 140 135 - 145 mmol/L   Potassium 4.2 3.5 - 5.1 mmol/L   Chloride 107 101 - 111 mmol/L   CO2 26 22 - 32 mmol/L   Glucose, Bld 114 (H) 65 - 99 mg/dL   BUN <5 (L) 6 - 20 mg/dL   Creatinine, Ser 0.86 0.44 - 1.00 mg/dL   Calcium 9.1 8.9 - 10.3 mg/dL   GFR calc non Af Amer >60 >60 mL/min   GFR calc Af Amer >60 >60 mL/min    Comment: (NOTE) The eGFR has been calculated  using the CKD EPI equation. This calculation has not been validated in all clinical situations. eGFR's persistently <60 mL/min signify possible Chronic Kidney Disease.    Anion gap 7 5 - 15  Glucose, capillary     Status: Abnormal   Collection Time: 08/19/15  7:39 AM  Result Value Ref Range   Glucose-Capillary 134 (H) 65 - 99 mg/dL    Imaging / Studies: Dg Cholangiogram Operative  08/18/2015  CLINICAL DATA:  Gallstones EXAM: INTRAOPERATIVE CHOLANGIOGRAM TECHNIQUE: Cholangiographic images from the C-arm fluoroscopic device were submitted for interpretation post-operatively. Please see the procedural report for the amount of contrast and the fluoroscopy time utilized. COMPARISON:  None. FINDINGS: Contrast fills the biliary tree and duodenum without filling defects in the common bile duct. IMPRESSION: Patent biliary tree. Electronically Signed   By: Marybelle Killings M.D.   On: 08/18/2015 14:10    Medications / Allergies:  Scheduled Meds: . acidophilus  2 capsule Oral Daily  . enoxaparin (LOVENOX) injection  40 mg Subcutaneous Q24H   Continuous Infusions: . dextrose 5 % and 0.45% NaCl 75 mL/hr at 08/19/15 0810   PRN Meds:.morphine injection, ondansetron **OR** ondansetron (ZOFRAN) IV, oxyCODONE-acetaminophen  Antibiotics: Anti-infectives    Start     Dose/Rate Route Frequency Ordered Stop   08/18/15 1200  cefOXitin (MEFOXIN) 1 g in dextrose 5 % 50 mL IVPB  Status:  Discontinued    Comments:  Send to or with patient   1 g 100 mL/hr over 30 Minutes Intravenous To Short Stay 08/18/15 0757 08/18/15 1354   08/14/15 0645  piperacillin-tazobactam (ZOSYN) IVPB 3.375 g  Status:  Discontinued     3.375 g 12.5 mL/hr over 240 Minutes Intravenous 3 times per day 08/14/15 6579 08/18/15 1354        Assessment/Plan POD#1 laparoscopic cholecystectomy with IOC--Dr. Donne Hazel -stable for DC, follow up arranged, instructions provided, work note and Rx for percocet provided.   Erby Pian,  [facility] Pager [phone]) For consults and floor pages call [phone](7A-4:30P)  08/19/2015 9:22 AM

## 2015-08-19 NOTE — Discharge Instructions (Signed)

## 2015-08-19 NOTE — Discharge Summary (Signed)
Physician Discharge Summary  [name] WUJ:[id number] DOB: [birth year] DOA: 08/14/2015  PCP: Tawni Carnes, MD  Admit date: 08/14/2015 Discharge date: 08/19/2015  Recommendations for Outpatient Follow-up:  1.  F/u with [facility] on January 18 at 8 AM  Discharge Diagnoses:  Principal Problem:   Gallstone pancreatitis   Discharge Condition: Stable, improved  Diet recommendation: Bland diet followed by low-fat diet until follow-up with surgery  Wt Readings from Last 3 Encounters:  08/18/15 50.44 kg (111 lb 3.2 oz)  01/22/15 54.386 kg (119 lb 14.4 oz)  11/16/13 54.432 kg (120 lb)    History of present illness:  The patient is a 16 are old female without any significant past medical history who presented with abdominal pain. Her lipase was greater than 3000 and she had elevated liver function tests. Her right upper quadrant ultrasound demonstrated a gallbladder stone and distal common bile duct stone. She was admitted for further workup.  Hospital Course:   Gallstone pancreatitis with lipase of greater than 3000 and elevated LFTs on admission. She was seen by gastroenterology and general surgery. She underwent laparoscopic cholecystectomy on 12/27 without evidence of stones in the bile duct on intraoperative cholangiogram. She did not require ERCP. She was able to tolerate a low-fat diet prior to discharge. She will follow-up with general surgery in approximately 2 weeks for reevaluation.  Occasional heartburn, advised to use Tums or famotidine or ranitidine over-the-counter as needed. Occurs only once a year.  Procedures:  Laparoscopic cholecystectomy and intraoperative cholangiogram on 12/27 by Dr. Dwain Sarna  Consultations:  [facility], Dr. Mayra Reel surgery, Dr. Emelia Loron  Discharge Exam: Filed Vitals:   08/19/15 0500 08/19/15 1000  BP: 94/51 103/59  Pulse: 53 61  Temp: 98.5 F (36.9 C) 98.4 F (36.9 C)  Resp:  16 18   Filed Vitals:   08/18/15 1359 08/18/15 2100 08/19/15 0500 08/19/15 1000  BP: 103/59  Pulse: 58 73 53 61  Temp: 98.1 F (36.7 C) 98.3 F (36.8 C) 98.5 F (36.9 C) 98.4 F (36.9 C)  TempSrc: Oral Oral Oral Oral  Resp: Height:      Weight:  50.44 kg (111 lb 3.2 oz)    SpO2: 100% 94% 99% 99%    General: Thin female, no acute distress Cardiovascular: Regular rate and rhythm, no murmurs, rubs, or gallops Respiratory: Clear to auscultation bilaterally Abdomen: NABS, soft, mildly tender to palpation over incisions without rebound or guarding MSK: No lower extremity edema, normal tone and bulk   Discharge Instructions      Discharge Instructions    Call MD for:  difficulty breathing, headache or visual disturbances    Complete by:  As directed      Call MD for:  extreme fatigue    Complete by:  As directed      Call MD for:  hives    Complete by:  As directed      Call MD for:  persistant dizziness or light-headedness    Complete by:  As directed      Call MD for:  persistant nausea and vomiting    Complete by:  As directed      Call MD for:  redness, tenderness, or signs of infection (pain, swelling, redness, odor or green/yellow discharge around incision site)    Complete by:  As directed      Call MD for:  severe uncontrolled pain    Complete by:  As directed      Call MD for:  temperature >100.4    Complete by:  As directed      Diet - low sodium heart healthy    Complete by:  As directed      Increase activity slowly    Complete by:  As directed             Medication List    TAKE these medications        oxyCODONE-acetaminophen 5-325 MG tablet  Commonly known as:  PERCOCET/ROXICET  Take 1-2 tablets by mouth every 6 (six) hours as needed for moderate pain.     ranitidine 150 MG capsule  Commonly known as:  ZANTAC  Take 1 capsule (150 mg total) by mouth daily as needed for heartburn.       Follow-up Information     Follow up with [facility] On 09/09/2015.   Specialty:  General Surgery   Why:  arrive by 8AM for a 8:30AM post op check    Contact information:   [address] [place] [address] [phone]        The results of significant diagnostics from this hospitalization (including imaging, microbiology, ancillary and laboratory) are listed below for reference.    Significant Diagnostic Studies: Dg Cholangiogram Operative  08/18/2015  CLINICAL DATA:  Gallstones EXAM: INTRAOPERATIVE CHOLANGIOGRAM TECHNIQUE: Cholangiographic images from the C-arm fluoroscopic device were submitted for interpretation post-operatively. Please see the procedural report for the amount of contrast and the fluoroscopy time utilized. COMPARISON:  None. FINDINGS: Contrast fills the biliary tree and duodenum without filling defects in the common bile duct. IMPRESSION: Patent biliary tree. Electronically Signed   By: Jolaine Click M.D.   On: 08/18/2015 14:10   US Abdomen Limited Ruq  08/14/2015  CLINICAL DATA:  Epigastric pain for 1 day EXAM: US ABDOMEN LIMITED - RIGHT UPPER QUADRANT COMPARISON:  None. FINDINGS: Gallbladder: Numerous layering gallstones. There is also a gallstone fixed within the gallbladder neck. There is no wall thickening or focal tenderness to suggest acute cholecystitis. Pericholecystic and peripancreatic edema. Bile ducts: Common bile duct measures 6 mm; there is mild diffuse intrahepatic ductal dilatation. Non shadowing but still convincing 4 mm defect within the distal common bile duct. Liver: No focal lesion identified. Within normal limits in parenchymal echogenicity. IMPRESSION: 1. Cholelithiasis including stone fixed in the gallbladder neck. 2. Distal CBD stone and biliary dilatation. 3. Pancreatitis. Electronically Signed   By: Marnee Spring M.D.   On: 08/14/2015 04:42    Microbiology: No results found for this or any previous visit (from the past 240 hour(s)).    Labs: Basic Metabolic Panel:  Recent Labs Lab 08/15/15 0450 08/16/15 0535 08/17/15 0633 08/18/15 0720 08/18/15 1701  NA 139 139 140 139 140  K 3.7 3.4* 3.7 3.6 4.2  CL 111 109 110 108 107  CO2 21* GLUCOSE 75 110* 108* 100* 114*  BUN 8 <5* <5* <5* <5*  CREATININE 0.87 0.91 0.92 0.86 0.86  CALCIUM 8.1* 8.2* 8.6* 8.7* 9.1   Liver Function Tests:  Recent Labs Lab 08/14/15 0730 08/15/15 0450 08/16/15 0535 08/17/15 0633 08/18/15 0720  AST 203* 59* ALT 247* 158* 111* 88* 72*  ALKPHOS 54 46 49 47 47  BILITOT 2.1* 1.1 0.9 0.7 0.5  PROT 6.1* 5.5* 5.8* 5.9* 6.3*  ALBUMIN 3.3* 2.8* 2.9* 3.2* 3.3*    Recent Labs Lab 08/14/15 0730 08/15/15 0450  08/16/15 0535 08/17/15 0633 08/18/15 0720  LIPASE >3000* 203* 81* 94* 81*   No results for input(s): AMMONIA in the last 168 hours. CBC:  Recent Labs Lab 08/14/15 0117 08/14/15 0730 08/15/15 0450 08/19/15 0458  WBC 7.1 5.1 4.3 6.2  NEUTROABS 5.3 3.1  --   --   HGB 12.3 10.9* 10.1* 11.7*  HCT 37.7 34.2* 31.2* 35.9*  MCV 78.2 78.8 79.4 79.6  PLT 194 170 145* 196   Cardiac Enzymes: No results for input(s): CKTOTAL, CKMB, CKMBINDEX, TROPONINI in the last 168 hours. BNP: BNP (last 3 results) No results for input(s): BNP in the last 8760 hours.  ProBNP (last 3 results) No results for input(s): PROBNP in the last 8760 hours.  CBG:  Recent Labs Lab 08/18/15 0604 08/18/15 0732 08/18/15 0932 08/18/15 1438 08/19/15 0739  GLUCAP 112* 122* 107* 101* 134*    Time coordinating discharge: 25 minutes  Signed:  Ozzie Remmers  [facility] 08/19/2015, 11:21 AM

## 2015-08-19 NOTE — Progress Notes (Signed)
Patient was previously on clear liquid diet. Patient is requesting to have her diet advanced, stating she doesn't want the broth anymore. Denies feeling nauseated. Still c/o moderate pain, but specifically only with movement, not in association with food. Diet advanced to full liquid. Will monitor.  Leanna BattlesEckelmann, Wava Kildow Eileen, RN.

## 2017-11-30 ENCOUNTER — Telehealth: Payer: Self-pay | Admitting: Family Medicine

## 2017-11-30 NOTE — Telephone Encounter (Signed)
Patient scheduled for an appointment to have this done.  Yuri Fana,CMA

## 2017-11-30 NOTE — Telephone Encounter (Signed)
Pt has a nexplanon that expires in June. She has medicaid thru 12-19-17.  Can she have it removed this month and replaced because she has medicaid thru end of April? Please advise

## 2017-12-15 ENCOUNTER — Other Ambulatory Visit: Payer: Self-pay

## 2017-12-15 ENCOUNTER — Encounter: Payer: Self-pay | Admitting: Family Medicine

## 2017-12-15 ENCOUNTER — Ambulatory Visit (INDEPENDENT_AMBULATORY_CARE_PROVIDER_SITE_OTHER): Payer: 59 | Admitting: Family Medicine

## 2017-12-15 VITALS — BP 100/66 | HR 79 | Temp 98.8°F | Wt 141.0 lb

## 2017-12-15 DIAGNOSIS — Z3046 Encounter for surveillance of implantable subdermal contraceptive: Secondary | ICD-10-CM | POA: Diagnosis not present

## 2017-12-15 DIAGNOSIS — Z30017 Encounter for initial prescription of implantable subdermal contraceptive: Secondary | ICD-10-CM

## 2017-12-15 DIAGNOSIS — Z975 Presence of (intrauterine) contraceptive device: Secondary | ICD-10-CM | POA: Insufficient documentation

## 2017-12-15 MED ORDER — ETONOGESTREL 68 MG ~~LOC~~ IMPL
68.0000 mg | DRUG_IMPLANT | Freq: Once | SUBCUTANEOUS | Status: AC
  Administered 2017-12-15: 68 mg via SUBCUTANEOUS

## 2017-12-15 NOTE — Progress Notes (Signed)
    Subjective:    Patient ID: [name], female    DOB: 05/03/1991, 27 y.o.   MRN: [id number]   CC: here for nexplanon removal and replacement  Has had 2 nexplanons before. Interested in removal and replacement. All questions and concerns answered. Informed consent signed.   Smoking status reviewed- non-smoker  Review of Systems- no vaginal bleeding, pelvic pain, concern for STDs   Objective:  BP 100/66   Pulse 79   Temp 98.8 F (37.1 C) (Oral)   Wt 141 lb (64 kg)   LMP 11/24/2017   SpO2 99%   BMI 27.54 kg/m  Vitals and nursing note reviewed  General: well nourished, in no acute distress Extremities: no edema or cyanosis. Nexplanon palpable in right upper extremity Skin: warm and dry, no rashes noted Neuro: alert and oriented, no focal deficits  PROCEDURE NOTE: Nexplanon removal & insertion Patient given informed consent, signed copy in the chart.  Appropriate time out taken  Pregnancy test was negative.  The patient's  right arm was prepped and draped in the usual sterile fashion. Local anaesthesia obtained using 1.5 cc of 1% lidocaine with epinephrine. Nexplanon removed from RUE. New Nexplanon was inserted per manufacturer's directions in RUE medial to prior location. Less than 1 cc blood loss. The insertion site covered with antibiotic ointment and a pressure bandage to minimize bruising. There were no complications and the patient tolerated the procedure well.  Device information was given in handout form. Patient is informed the removal date will be in three years and package insert card filled out and given to her.   Assessment & Plan:   1. Nexplanon removal Nexplanon removed with assistance from Dr. Leveda AnnaHensel.   2. Nexplanon insertion Nexplanon placed in RUE. No complications.   Return in 3 years (on 12/15/2020).   Dolores PattyAngela Lamira Borin, DO Family Medicine Resident PGY-2

## 2017-12-15 NOTE — Addendum Note (Signed)
Addended by: Steva ColderSCOTT, EMILY P on: 12/15/2017 12:13 PM   Modules accepted: Orders

## 2017-12-15 NOTE — Patient Instructions (Signed)
Nexplanon Instructions After Insertion   Keep bandage clean and dry for 24 hours   May use ice/Tylenol/Ibuprofen for soreness or pain   If you develop fever, drainage or increased warmth from incision site-contact office immediately   If you have questions or concerns please do not hesitate to call at [phone].  Dolores PattyAngela Durand Wittmeyer, DO PGY-2, [facility] 12/15/2017 10:54 AM
